# Patient Record
Sex: Female | Born: 1980
Health system: Southern US, Community
[De-identification: ages and names within clinical notes are randomized; demographics above are authoritative.]

## PROBLEM LIST (undated history)

## (undated) ENCOUNTER — Inpatient Hospital Stay (HOSPITAL_COMMUNITY): Payer: Self-pay

## (undated) DIAGNOSIS — R51 Headache: Secondary | ICD-10-CM

## (undated) DIAGNOSIS — K5900 Constipation, unspecified: Secondary | ICD-10-CM

## (undated) DIAGNOSIS — G43909 Migraine, unspecified, not intractable, without status migrainosus: Secondary | ICD-10-CM

## (undated) DIAGNOSIS — N390 Urinary tract infection, site not specified: Secondary | ICD-10-CM

## (undated) DIAGNOSIS — N61 Mastitis without abscess: Secondary | ICD-10-CM

## (undated) DIAGNOSIS — D239 Other benign neoplasm of skin, unspecified: Secondary | ICD-10-CM

## (undated) HISTORY — PX: NO PAST SURGERIES: SHX2092

## (undated) HISTORY — DX: Other benign neoplasm of skin, unspecified: D23.9

## (undated) HISTORY — DX: Headache: R51

## (undated) HISTORY — DX: Migraine, unspecified, not intractable, without status migrainosus: G43.909

## (undated) HISTORY — DX: Urinary tract infection, site not specified: N39.0

---

## 2008-08-27 ENCOUNTER — Ambulatory Visit: Payer: Self-pay | Admitting: Gynecology

## 2008-10-18 ENCOUNTER — Other Ambulatory Visit: Admission: RE | Admit: 2008-10-18 | Discharge: 2008-10-18 | Payer: Self-pay | Admitting: Gynecology

## 2008-10-18 ENCOUNTER — Ambulatory Visit: Payer: Self-pay | Admitting: Gynecology

## 2008-10-18 ENCOUNTER — Encounter: Payer: Self-pay | Admitting: Gynecology

## 2009-11-22 ENCOUNTER — Ambulatory Visit: Payer: Self-pay | Admitting: Gynecology

## 2009-12-13 ENCOUNTER — Other Ambulatory Visit: Admission: RE | Admit: 2009-12-13 | Discharge: 2009-12-13 | Payer: Self-pay | Admitting: Gynecology

## 2009-12-13 ENCOUNTER — Ambulatory Visit: Payer: Self-pay | Admitting: Gynecology

## 2010-03-12 ENCOUNTER — Ambulatory Visit: Payer: Self-pay | Admitting: Gynecology

## 2010-06-05 ENCOUNTER — Institutional Professional Consult (permissible substitution) (INDEPENDENT_AMBULATORY_CARE_PROVIDER_SITE_OTHER): Payer: BC Managed Care – PPO | Admitting: Gynecology

## 2010-06-05 DIAGNOSIS — N912 Amenorrhea, unspecified: Secondary | ICD-10-CM

## 2010-08-12 ENCOUNTER — Ambulatory Visit (INDEPENDENT_AMBULATORY_CARE_PROVIDER_SITE_OTHER): Payer: BC Managed Care – PPO | Admitting: Gynecology

## 2010-08-12 DIAGNOSIS — N912 Amenorrhea, unspecified: Secondary | ICD-10-CM

## 2010-08-12 DIAGNOSIS — IMO0002 Reserved for concepts with insufficient information to code with codable children: Secondary | ICD-10-CM

## 2010-08-12 DIAGNOSIS — R82998 Other abnormal findings in urine: Secondary | ICD-10-CM

## 2010-08-12 DIAGNOSIS — N949 Unspecified condition associated with female genital organs and menstrual cycle: Secondary | ICD-10-CM

## 2010-08-12 DIAGNOSIS — R141 Gas pain: Secondary | ICD-10-CM

## 2010-12-18 ENCOUNTER — Other Ambulatory Visit: Payer: Self-pay | Admitting: Obstetrics and Gynecology

## 2010-12-18 DIAGNOSIS — N6310 Unspecified lump in the right breast, unspecified quadrant: Secondary | ICD-10-CM

## 2010-12-25 ENCOUNTER — Ambulatory Visit
Admission: RE | Admit: 2010-12-25 | Discharge: 2010-12-25 | Disposition: A | Discharge status: Home / Self Care | Payer: BC Managed Care – PPO | Source: Ambulatory Visit | Attending: Obstetrics and Gynecology | Admitting: Obstetrics and Gynecology

## 2010-12-25 DIAGNOSIS — N6310 Unspecified lump in the right breast, unspecified quadrant: Secondary | ICD-10-CM

## 2011-01-06 ENCOUNTER — Ambulatory Visit
Admission: RE | Admit: 2011-01-06 | Discharge: 2011-01-06 | Disposition: A | Discharge status: Home / Self Care | Payer: BC Managed Care – PPO | Source: Ambulatory Visit | Attending: Obstetrics and Gynecology | Admitting: Obstetrics and Gynecology

## 2011-07-03 ENCOUNTER — Other Ambulatory Visit (INDEPENDENT_AMBULATORY_CARE_PROVIDER_SITE_OTHER): Payer: BC Managed Care – PPO

## 2011-07-03 DIAGNOSIS — N979 Female infertility, unspecified: Secondary | ICD-10-CM

## 2011-07-13 ENCOUNTER — Encounter (INDEPENDENT_AMBULATORY_CARE_PROVIDER_SITE_OTHER): Payer: BC Managed Care – PPO | Admitting: Obstetrics and Gynecology

## 2011-07-13 DIAGNOSIS — N970 Female infertility associated with anovulation: Secondary | ICD-10-CM

## 2011-08-10 ENCOUNTER — Other Ambulatory Visit: Payer: Self-pay

## 2011-08-10 ENCOUNTER — Encounter (INDEPENDENT_AMBULATORY_CARE_PROVIDER_SITE_OTHER): Payer: BC Managed Care – PPO | Admitting: Obstetrics and Gynecology

## 2011-08-10 DIAGNOSIS — N915 Oligomenorrhea, unspecified: Secondary | ICD-10-CM

## 2011-08-10 DIAGNOSIS — G4700 Insomnia, unspecified: Secondary | ICD-10-CM

## 2012-01-27 LAB — OB RESULTS CONSOLE RUBELLA ANTIBODY, IGM: Rubella: IMMUNE

## 2012-01-27 LAB — OB RESULTS CONSOLE ANTIBODY SCREEN: Antibody Screen: NEGATIVE

## 2012-01-27 LAB — OB RESULTS CONSOLE ABO/RH

## 2012-03-07 ENCOUNTER — Ambulatory Visit (INDEPENDENT_AMBULATORY_CARE_PROVIDER_SITE_OTHER): Payer: BC Managed Care – PPO | Admitting: Family Medicine

## 2012-03-07 ENCOUNTER — Encounter: Payer: Self-pay | Admitting: Family Medicine

## 2012-03-07 VITALS — BP 102/60 | HR 86 | Temp 98.8°F | Wt 134.0 lb

## 2012-03-07 DIAGNOSIS — Z7689 Persons encountering health services in other specified circumstances: Secondary | ICD-10-CM

## 2012-03-07 DIAGNOSIS — H6990 Unspecified Eustachian tube disorder, unspecified ear: Secondary | ICD-10-CM

## 2012-03-07 DIAGNOSIS — R51 Headache: Secondary | ICD-10-CM

## 2012-03-07 DIAGNOSIS — Z3490 Encounter for supervision of normal pregnancy, unspecified, unspecified trimester: Secondary | ICD-10-CM

## 2012-03-07 DIAGNOSIS — Z7189 Other specified counseling: Secondary | ICD-10-CM

## 2012-03-07 DIAGNOSIS — J309 Allergic rhinitis, unspecified: Secondary | ICD-10-CM

## 2012-03-07 DIAGNOSIS — Z331 Pregnant state, incidental: Secondary | ICD-10-CM

## 2012-03-07 NOTE — Patient Instructions (Addendum)
You can use nasal saline to help with your sinuses. If you use a refillable bottle - clean according to instructions.  We recommend the following healthy lifestyle measures: - eat a healthy diet consisting of lots of vegetables, fruits, beans, nuts, seeds, healthy meats such as white chicken and fish and whole grains.  - avoid fried foods, fast food, processed foods, sodas, red meet and other fattening foods.  - get a least 150 minutes of aerobic exercise per week.   Follow up as needed and after your pregnancy.

## 2012-03-07 NOTE — Progress Notes (Signed)
Chief Complaint  Patient presents with  . Establish Care    HPI: Michele Gilbert is here to establish care. This location is more convenient and husband sees Dr. Caryl Never. She currently is [redacted] weeks pregnant with her first child and is followed closely by her obstetrician as had been trying to get pregnant for 2 years.  Has the following concerns today:  Nasal congestion and ears popping -last several weeks -has seasonal allergies  Headaches: -chronic and unchanged -has discussed with her obstetrician and told could take tylenol -wonders if anything else she can use  Other Providers: -Dr. Seymour Bars ob/gyn: followed closely for her pregnancy, has had two [place] this pregnancy due to vaginal bleeding, but doing well now -Dermatology for hair loss in the past - no medications used in the past  Vaccines UTD per patient - had flu 2 weeks ago and ob/gyn checking labs and doing vaccines and pap.  ROS: See pertinent positives and negatives per HPI.  History reviewed. No pertinent past medical history.  No family history on file.  History   Social History  . Marital Status: Married    Spouse Name: N/A    Number of Children: N/A  . Years of Education: N/A   Social History Main Topics  . Smoking status: Never Smoker   . Smokeless tobacco: None  . Alcohol Use: No  . Drug Use: None  . Sexually Active: None   Other Topics Concern  . None   Social History Narrative  . None    Current outpatient prescriptions:ferrous fumarate (HEMOCYTE - 106 MG FE) 325 (106 FE) MG TABS, Take 1 tablet by mouth., Disp: , Rfl: ;  Prenatal Vit-Fe Fumarate-FA (MULTIVITAMIN-PRENATAL) 27-0.8 MG TABS, Take 1 tablet by mouth daily., Disp: , Rfl:   EXAM:  Filed Vitals:   03/07/12 1622  BP: 102/60  Pulse: 86  Temp: 98.8 F (37.1 C)    There is no height on file to calculate BMI.  GENERAL: vitals reviewed and listed above, alert, oriented, appears well hydrated and in no acute distress  HEENT:  atraumatic, conjunttiva clear, no obvious abnormalities on inspection of external nose and ears, normal appearance of ear canals and TMs, clear nasal congestion, mild post oropharyngeal erythema with PND, no tonsillar edema or exudate, no sinus TTP  NECK: no obvious masses on inspection  LUNGS: clear to auscultation bilaterally, no wheezes, rales or rhonchi, good air movement  CV: HRRR, no peripheral edema  MS: moves all extremities without noticeable abnormality  PSYCH: pleasant and cooperative, no obvious depression or anxiety  ASSESSMENT AND PLAN:  Discussed the following assessment and plan:  1. Allergic rhinitis   2. Encounter to establish care with new doctor   3. Pregnancy   4. Eustachian tube disorder   5. Headache    -advised nasal saline for eustachian tube dysfunction  -topical menthol products for headaches -obstetrician managing pregnancy and gyn care -We reviewed the PMH, PSH, FH, SH, Meds and Allergies. -We provided refills for any medications we will prescribe as needed. -We addressed current concerns per orders and patient instructions. -We have asked for records for pertinent exams, studies, vaccines and notes from previous providers. -We have advised patient to follow up per instructions below. -Influenza vaccine given today  -Patient advised to return or notify a doctor immediately if symptoms worsen or persist or new concerns arise.  Patient Instructions  You can use nasal saline to help with your sinuses. If you use a refillable bottle - clean  according to instructions.  We recommend the following healthy lifestyle measures: - eat a healthy diet consisting of lots of vegetables, fruits, beans, nuts, seeds, healthy meats such as white chicken and fish and whole grains.  - avoid fried foods, fast food, processed foods, sodas, red meet and other fattening foods.  - get a least 150 minutes of aerobic exercise per week.   Follow up as needed and after your  pregnancy.        Kriste Basque R.

## 2012-05-04 NOTE — L&D Delivery Note (Signed)
Delivery Note At 7:54 PM a viable female was delivered via Vaginal, Spontaneous Delivery (Presentation: DOA;  ).  APGAR: per nursing notes , ; weight  Per nursing notes Placenta status: Intact, Spontaneous.  Cord: 3 vessels with the following complications: None.  Cord pH: not needed. Moderate meconium, no cord issues, spontaneous cry prior to suctioning  Anesthesia: Epidural Local  Episiotomy: None Lacerations: 2nd degree; L Sulcus, R sulcus extending to R labia majora Suture Repair: 3.0 vicryl rapide, 3 individual running sutures to each separate tear Est. Blood Loss (mL): 400   Mom to postpartum.  Baby to nursery-stable.  Tavita Eastham A. 09/02/2012, 8:28 PM

## 2012-06-13 ENCOUNTER — Encounter (HOSPITAL_COMMUNITY): Payer: Self-pay | Admitting: *Deleted

## 2012-06-13 ENCOUNTER — Inpatient Hospital Stay (HOSPITAL_COMMUNITY)
Admission: AD | Admit: 2012-06-13 | Discharge: 2012-06-13 | Disposition: A | Discharge status: Home / Self Care | Payer: BC Managed Care – PPO | Source: Ambulatory Visit | Attending: Obstetrics and Gynecology | Admitting: Obstetrics and Gynecology

## 2012-06-13 ENCOUNTER — Inpatient Hospital Stay (HOSPITAL_COMMUNITY): Payer: BC Managed Care – PPO

## 2012-06-13 DIAGNOSIS — O26859 Spotting complicating pregnancy, unspecified trimester: Secondary | ICD-10-CM | POA: Insufficient documentation

## 2012-06-13 LAB — URINALYSIS, ROUTINE W REFLEX MICROSCOPIC
Bilirubin Urine: NEGATIVE
Ketones, ur: NEGATIVE mg/dL
Nitrite: NEGATIVE
pH: 7 (ref 5.0–8.0)

## 2012-06-13 LAB — URINE MICROSCOPIC-ADD ON

## 2012-06-13 NOTE — MAU Provider Note (Signed)
  History    spotting  CSN: [id number]  Arrival date and time: 06/13/12 1037   None     Chief Complaint  Patient presents with  . Vaginal Bleeding   HPI  OB History   Grav Para Term Preterm Abortions TAB SAB Ect Mult Living   1               Past Medical History  Diagnosis Date  . Headache     frequent  . Migraine   . UTI (urinary tract infection)     Past Surgical History  Procedure Laterality Date  . No past surgeries      Family History  Problem Relation Age of Onset  . Hyperlipidemia Father   . Colon cancer      grandparent  . Diabetes Other     History  Substance Use Topics  . Smoking status: Never Smoker   . Smokeless tobacco: Not on file  . Alcohol Use: No    Allergies:  Allergies  Allergen Reactions  . Latex Dermatitis    Prescriptions prior to admission  Medication Sig Dispense Refill  . acetaminophen (TYLENOL) 500 MG tablet Take 1,000 mg by mouth every 6 (six) hours as needed for pain. For pain      . Prenatal Vit-Fe Fumarate-FA (MULTIVITAMIN-PRENATAL) 27-0.8 MG TABS Take 1 tablet by mouth daily.        ROS Physical Exam   Blood pressure 117/74, pulse 84, temperature 98.2 F (36.8 C), temperature source Oral, resp. rate 20, height 5\' 3"  (1.6 m).  Physical Exam  MAU Course  Procedures  MDM na  Assessment and Plan  Third trimester spotting Nl CL on sono, nl AFI, nl placenta DC home  Michele Gilbert J 06/13/2012, 1:38 PM

## 2012-06-13 NOTE — H&P (Signed)
NAMEPEJA, ALLENDER NO.:  [id number]  MEDICAL RECORD NO.:  [id number]  LOCATION:  NW29                          FACILITY:  [facility]  PHYSICIAN:  Lenoard Aden, M.D.DATE OF BIRTH:  [birth year]  DATE OF ADMISSION:  06/13/2012 DATE OF DISCHARGE:                             HISTORY & PHYSICAL   CHIEF COMPLAINT:  Spotting at 27 weeks.  She is a 32 year old, [place] female G1, P0, at 29 and 6 days of gestation who presents with spotting this morning, had intercourse 48 hours prior.  Denies excessive cramping or heavy bleeding.  No leakage of fluid.  Good fetal movement.  She has no known drug allergies.  MEDICATIONS:  Prenatal vitamins.  She has a noncontributory obstetric history.  She is a nonsmoker, nondrinker.  She denies domestic or physical violence.  She has a family history of kidney stones, migraine headaches, hypertension, heart disease, and diabetes.  PHYSICAL EXAMINATION:  GENERAL:  A well-developed, well-nourished white female, in no acute distress. HEENT:  Normal. NECK:  Supple.  Full range of motion. LUNGS:  Clear. HEART:  Regular rhythm. ABDOMEN:  Soft, gravid, nontender.  No CVA tenderness. EXTREMITIES:  There are no cords. NEUROLOGIC:  Nonfocal. SKIN:  Intact.  IMPRESSION:  Fetal heart tones in the 120-130 beat per minute range with good acceleration.  Uterine irritability noted.  Ultrasound reveals cervical length to be 3.8 cm.  Normal AFI normal placenta.  IMPRESSION:  A 27-week intrauterine pregnancy with spotting, noted no evidence of acute bleeding, normal appearing placenta, normal AFI, normal cervical length, reassuring fetal surveillance.  PLAN:  Discharge home.  Bleeding precautions given.  Follow up in the office as scheduled.     Lenoard Aden, M.D.     RJT/MEDQ  D:  06/13/2012  T:  06/13/2012  Job:  [id number]

## 2012-06-13 NOTE — MAU Note (Signed)
Pt reports having lower back pain and pelvic pressure.  Went to [facility] and noticed some bleeding.

## 2012-06-14 LAB — URINE CULTURE: Colony Count: 65000

## 2012-09-02 ENCOUNTER — Encounter (HOSPITAL_COMMUNITY): Payer: Self-pay | Admitting: Anesthesiology

## 2012-09-02 ENCOUNTER — Inpatient Hospital Stay (HOSPITAL_COMMUNITY): Payer: BC Managed Care – PPO | Admitting: Anesthesiology

## 2012-09-02 ENCOUNTER — Inpatient Hospital Stay (HOSPITAL_COMMUNITY)
Admission: AD | Admit: 2012-09-02 | Discharge: 2012-09-04 | DRG: 373 | Disposition: A | Discharge status: Home / Self Care | Payer: BC Managed Care – PPO | Source: Ambulatory Visit | Attending: Obstetrics & Gynecology | Admitting: Obstetrics & Gynecology

## 2012-09-02 ENCOUNTER — Encounter (HOSPITAL_COMMUNITY): Payer: Self-pay | Admitting: *Deleted

## 2012-09-02 DIAGNOSIS — O9903 Anemia complicating the puerperium: Secondary | ICD-10-CM | POA: Diagnosis not present

## 2012-09-02 DIAGNOSIS — D62 Acute posthemorrhagic anemia: Secondary | ICD-10-CM | POA: Diagnosis not present

## 2012-09-02 LAB — TYPE AND SCREEN
ABO/RH(D): O POS
Antibody Screen: NEGATIVE

## 2012-09-02 LAB — CBC
MCH: 24 pg — ABNORMAL LOW (ref 26.0–34.0)
MCHC: 31.5 g/dL (ref 30.0–36.0)
MCV: 76 fL — ABNORMAL LOW (ref 78.0–100.0)
Platelets: 255 10*3/uL (ref 150–400)
RDW: 16.6 % — ABNORMAL HIGH (ref 11.5–15.5)
WBC: 11.5 10*3/uL — ABNORMAL HIGH (ref 4.0–10.5)

## 2012-09-02 LAB — ABO/RH: ABO/RH(D): O POS

## 2012-09-02 MED ORDER — ACETAMINOPHEN 325 MG PO TABS: 650.0000 mg | ORAL_TABLET | ORAL | Status: DC | PRN

## 2012-09-02 MED ORDER — FENTANYL 2.5 MCG/ML BUPIVACAINE 1/10 % EPIDURAL INFUSION (WH - ANES)
INTRAMUSCULAR | Status: DC | PRN
  Administered 2012-09-02: 14 mL/h via EPIDURAL

## 2012-09-02 MED ORDER — ZOLPIDEM TARTRATE 5 MG PO TABS: 5.0000 mg | ORAL_TABLET | Freq: Every evening | ORAL | Status: DC | PRN

## 2012-09-02 MED ORDER — OXYCODONE-ACETAMINOPHEN 5-325 MG PO TABS: 1.0000 | ORAL_TABLET | ORAL | Status: DC | PRN

## 2012-09-02 MED ORDER — DIPHENHYDRAMINE HCL 50 MG/ML IJ SOLN: 12.5000 mg | INTRAMUSCULAR | Status: DC | PRN

## 2012-09-02 MED ORDER — OXYTOCIN BOLUS FROM INFUSION
500.0000 mL | INTRAVENOUS | Status: DC
  Administered 2012-09-02: 500 mL via INTRAVENOUS

## 2012-09-02 MED ORDER — LANOLIN HYDROUS EX OINT: TOPICAL_OINTMENT | CUTANEOUS | Status: DC | PRN

## 2012-09-02 MED ORDER — LACTATED RINGERS IV SOLN
INTRAVENOUS | Status: DC
  Administered 2012-09-02: 06:00:00 via INTRAVENOUS

## 2012-09-02 MED ORDER — PRENATAL MULTIVITAMIN CH
1.0000 | ORAL_TABLET | Freq: Every day | ORAL | Status: DC
  Administered 2012-09-03 – 2012-09-04 (×2): 1 via ORAL
  Filled 2012-09-02 (×2): qty 1

## 2012-09-02 MED ORDER — LIDOCAINE HCL (PF) 1 % IJ SOLN
INTRAMUSCULAR | Status: DC | PRN
  Administered 2012-09-02 (×2): 8 mL

## 2012-09-02 MED ORDER — ONDANSETRON HCL 4 MG/2ML IJ SOLN: 4.0000 mg | INTRAMUSCULAR | Status: DC | PRN

## 2012-09-02 MED ORDER — PHENYLEPHRINE 40 MCG/ML (10ML) SYRINGE FOR IV PUSH (FOR BLOOD PRESSURE SUPPORT)
80.0000 ug | PREFILLED_SYRINGE | INTRAVENOUS | Status: DC | PRN
  Administered 2012-09-02: 200 ug via INTRAVENOUS
  Filled 2012-09-02: qty 2

## 2012-09-02 MED ORDER — LIDOCAINE HCL (PF) 1 % IJ SOLN
30.0000 mL | INTRAMUSCULAR | Status: DC | PRN
  Administered 2012-09-02: 30 mL via SUBCUTANEOUS
  Filled 2012-09-02 (×2): qty 30

## 2012-09-02 MED ORDER — CITRIC ACID-SODIUM CITRATE 334-500 MG/5ML PO SOLN: 30.0000 mL | ORAL | Status: DC | PRN

## 2012-09-02 MED ORDER — OXYTOCIN 40 UNITS IN LACTATED RINGERS INFUSION - SIMPLE MED
62.5000 mL/h | INTRAVENOUS | Status: DC
  Filled 2012-09-02: qty 1000

## 2012-09-02 MED ORDER — OXYTOCIN 40 UNITS IN LACTATED RINGERS INFUSION - SIMPLE MED
1.0000 m[IU]/min | INTRAVENOUS | Status: DC
  Administered 2012-09-02: 6 m[IU]/min via INTRAVENOUS
  Administered 2012-09-02: 2 m[IU]/min via INTRAVENOUS

## 2012-09-02 MED ORDER — IBUPROFEN 600 MG PO TABS
600.0000 mg | ORAL_TABLET | Freq: Four times a day (QID) | ORAL | Status: DC
  Administered 2012-09-03 – 2012-09-04 (×6): 600 mg via ORAL
  Filled 2012-09-02 (×6): qty 1

## 2012-09-02 MED ORDER — DIPHENHYDRAMINE HCL 25 MG PO CAPS: 25.0000 mg | ORAL_CAPSULE | Freq: Four times a day (QID) | ORAL | Status: DC | PRN

## 2012-09-02 MED ORDER — WITCH HAZEL-GLYCERIN EX PADS: 1.0000 "application " | MEDICATED_PAD | CUTANEOUS | Status: DC | PRN

## 2012-09-02 MED ORDER — EPHEDRINE 5 MG/ML INJ
10.0000 mg | INTRAVENOUS | Status: DC | PRN
  Filled 2012-09-02: qty 2

## 2012-09-02 MED ORDER — BENZOCAINE-MENTHOL 20-0.5 % EX AERO
1.0000 "application " | INHALATION_SPRAY | CUTANEOUS | Status: DC | PRN
  Administered 2012-09-03: 1 via TOPICAL
  Filled 2012-09-02 (×2): qty 56

## 2012-09-02 MED ORDER — ONDANSETRON HCL 4 MG PO TABS: 4.0000 mg | ORAL_TABLET | ORAL | Status: DC | PRN

## 2012-09-02 MED ORDER — LACTATED RINGERS IV SOLN: 500.0000 mL | INTRAVENOUS | Status: DC | PRN

## 2012-09-02 MED ORDER — POLYETHYLENE GLYCOL 3350 17 G PO PACK
17.0000 g | PACK | Freq: Every day | ORAL | Status: DC | PRN
  Filled 2012-09-02: qty 1

## 2012-09-02 MED ORDER — DIBUCAINE 1 % RE OINT
1.0000 "application " | TOPICAL_OINTMENT | RECTAL | Status: DC | PRN
  Filled 2012-09-02: qty 28

## 2012-09-02 MED ORDER — PHENYLEPHRINE 40 MCG/ML (10ML) SYRINGE FOR IV PUSH (FOR BLOOD PRESSURE SUPPORT)
80.0000 ug | PREFILLED_SYRINGE | INTRAVENOUS | Status: DC | PRN
  Filled 2012-09-02: qty 5
  Filled 2012-09-02: qty 2

## 2012-09-02 MED ORDER — LACTATED RINGERS IV SOLN
500.0000 mL | Freq: Once | INTRAVENOUS | Status: AC
  Administered 2012-09-02: 1000 mL via INTRAVENOUS

## 2012-09-02 MED ORDER — TERBUTALINE SULFATE 1 MG/ML IJ SOLN: 0.2500 mg | Freq: Once | INTRAMUSCULAR | Status: DC | PRN

## 2012-09-02 MED ORDER — ONDANSETRON HCL 4 MG/2ML IJ SOLN: 4.0000 mg | Freq: Four times a day (QID) | INTRAMUSCULAR | Status: DC | PRN

## 2012-09-02 MED ORDER — TETANUS-DIPHTH-ACELL PERTUSSIS 5-2.5-18.5 LF-MCG/0.5 IM SUSP: 0.5000 mL | Freq: Once | INTRAMUSCULAR | Status: DC

## 2012-09-02 MED ORDER — FLEET ENEMA 7-19 GM/118ML RE ENEM: 1.0000 | ENEMA | RECTAL | Status: DC | PRN

## 2012-09-02 MED ORDER — EPHEDRINE 5 MG/ML INJ
10.0000 mg | INTRAVENOUS | Status: DC | PRN
  Filled 2012-09-02: qty 4
  Filled 2012-09-02: qty 2

## 2012-09-02 MED ORDER — SIMETHICONE 80 MG PO CHEW: 80.0000 mg | CHEWABLE_TABLET | ORAL | Status: DC | PRN

## 2012-09-02 MED ORDER — FENTANYL 2.5 MCG/ML BUPIVACAINE 1/10 % EPIDURAL INFUSION (WH - ANES)
14.0000 mL/h | INTRAMUSCULAR | Status: DC | PRN
  Administered 2012-09-02: 14 mL/h via EPIDURAL
  Filled 2012-09-02 (×2): qty 125

## 2012-09-02 MED ORDER — IBUPROFEN 600 MG PO TABS
600.0000 mg | ORAL_TABLET | Freq: Four times a day (QID) | ORAL | Status: DC | PRN
  Administered 2012-09-02: 600 mg via ORAL
  Filled 2012-09-02: qty 1

## 2012-09-02 MED ORDER — OXYCODONE-ACETAMINOPHEN 5-325 MG PO TABS
1.0000 | ORAL_TABLET | ORAL | Status: DC | PRN
  Administered 2012-09-03 (×2): 2 via ORAL
  Administered 2012-09-03 (×2): 1 via ORAL
  Administered 2012-09-04: 2 via ORAL
  Filled 2012-09-02: qty 1
  Filled 2012-09-02: qty 2
  Filled 2012-09-02: qty 1
  Filled 2012-09-02 (×2): qty 2

## 2012-09-02 MED ORDER — BISACODYL 10 MG RE SUPP: 10.0000 mg | Freq: Every day | RECTAL | Status: DC | PRN

## 2012-09-02 MED ORDER — FLEET ENEMA 7-19 GM/118ML RE ENEM: 1.0000 | ENEMA | Freq: Every day | RECTAL | Status: DC | PRN

## 2012-09-02 MED ORDER — SENNOSIDES-DOCUSATE SODIUM 8.6-50 MG PO TABS
2.0000 | ORAL_TABLET | Freq: Every day | ORAL | Status: DC
  Administered 2012-09-03: 2 via ORAL

## 2012-09-02 MED ORDER — DOCUSATE SODIUM 100 MG PO CAPS
100.0000 mg | ORAL_CAPSULE | Freq: Two times a day (BID) | ORAL | Status: DC
  Administered 2012-09-03 – 2012-09-04 (×3): 100 mg via ORAL
  Filled 2012-09-02 (×3): qty 1

## 2012-09-02 NOTE — H&P (Signed)
Michele Gilbert is a 32 y.o. female G1 at 39.3 wks presenting in active labor with contractions for several hours, no leaking of fluid or bleeding. Good FMs.  [facility]- Dr Seymour Bars, uncomplicated, nl labs, ultrasound with good interval growth sono, GBS neg. Took TDaP. This spontaneous preg after infertility.   History OB History   Grav Para Term Preterm Abortions TAB SAB Ect Mult Living   1              Past Medical History  Diagnosis Date  . Headache     frequent  . Migraine   . UTI (urinary tract infection)    Past Surgical History  Procedure Laterality Date  . No past surgeries     Family History: family history includes Colon cancer in an unspecified family member; Diabetes in her other; and Hyperlipidemia in her father. Social History:  reports that she has never smoked. She does not have any smokeless tobacco history on file. She reports that she does not drink alcohol or use illicit drugs.   Prenatal Transfer Tool  Maternal Diabetes: No Genetic Screening: Normal- ultrascreen neg, AFP1 neg. Maternal Ultrasounds/Referrals: Normal Fetal Ultrasounds or other Referrals:  None Maternal Substance Abuse:  No Significant Maternal Medications:  None Significant Maternal Lab Results:  Lab values include: Group B Strep negative Other Comments:  None  Review of Systems  Constitutional: Negative for fever.  Respiratory: Negative for cough.   Cardiovascular: Negative for chest pain.  Genitourinary: Negative for dysuria.  Skin: Negative for rash.  Neurological: Negative for headaches.  Psychiatric/Behavioral: Negative for depression.   Exam Physical Exam  Blood pressure 104/56, pulse 75, temperature 99.1 F (37.3 C), temperature source Oral, resp. rate 22, height 5\' 3"  (1.6 m), weight 162 lb (73.483 kg), SpO2 96.00%.  A&O x 3, no acute distress. Pleasant HEENT neg, no thyromegaly Lungs CTA bilat CV RRR, S1S2 normal Abdo soft, non tender, non acute Extr no edema/  tenderness Pelvic Dilation: 8, Effacement (%): 100, Station: -1. AROM, clear fluid.  FHT  130s/ + accels/ no decels/ moderate variab. Category I Toco q 5 minutes.  Prenatal labs: ABO, Rh: --/--/O POS (05/02 7846) Antibody: PENDING (05/02 0603) Rubella: Immune (09/25 0000) RPR: Nonreactive (09/25 0000)  HBsAg: Negative (09/25 0000)  HIV: Non-reactive (09/25 0000)  GBS: Negative (04/02 0000)  Glucola nl Anatomy sono normal, Female, growth sono last wk, normal  Ultrascreen neg, AFP1 neg   Assessment/Plan: 32 yo, G1 at 39.3 wks in active labor, s/p Epidural and doing well, FHT-category I Amniotomy, clear fluid, GBS neg. EFW 7.1/2 lbs. Anticipate SVD.    Cadie Sorci R 09/02/2012, 9:57 AM

## 2012-09-02 NOTE — Progress Notes (Signed)
S: Doing well, no complaints, pain well controlled with dense epidural, not feeling contractions. Pushing for 1.5 hrs in total with about 1 hr rest and re-positioning in between pushing times  O: BP 114/63  Pulse 113  Temp(Src) 99.4 F (37.4 C) (Axillary)  Resp 18  Ht 5\' 3"  (1.6 m)  Wt 73.483 kg (162 lb)  BMI 28.7 kg/m2  SpO2 96%   FHT:  FHR: 120s bpm, variability: moderate,  accelerations:  Present,  decelerations:  Absent UC:   irregular, every 4-5 minutes SVE:   Dilation: 10 Effacement (%): 100 Station: +2 Exam by:: Dherr rn OP position   A / P:  31 y.o.  Obstetric History   G1   P0   T0   P0   A0   TAB0   SAB0   E0   M0   L0    at [redacted]w[redacted]d Augmentation of labor, progressing well Just now started pitocin to help with frequency and strength of contractions as contractions have spaced out after rest. Will cont to increase pitocin as pushing weakly, will also decrease epidural EFW 8.5# by u/s last wk but pelvis appears adequate, no significant molding/ caput  Fetal Wellbeing:  Category I Pain Control:  Epidural  Anticipated MOD:  NSVD  Michele Gilbert A. 09/02/2012, 7:04 PM

## 2012-09-02 NOTE — Anesthesia Preprocedure Evaluation (Signed)
Anesthesia Evaluation  Patient identified by MRN, date of birth, ID band Patient awake    Reviewed: Allergy & Precautions, H&P , NPO status , Patient's Chart, lab work & pertinent test results  Airway Mallampati: I TM Distance: >3 FB Neck ROM: full    Dental no notable dental hx.    Pulmonary neg pulmonary ROS,    Pulmonary exam normal       Cardiovascular negative cardio ROS      Neuro/Psych negative psych ROS   GI/Hepatic negative GI ROS, Neg liver ROS,   Endo/Other  negative endocrine ROS  Renal/GU negative Renal ROS  negative genitourinary   Musculoskeletal negative musculoskeletal ROS (+)   Abdominal Normal abdominal exam  (+)   Peds negative pediatric ROS (+)  Hematology negative hematology ROS (+)   Anesthesia Other Findings   Reproductive/Obstetrics (+) Pregnancy                           Anesthesia Physical Anesthesia Plan  ASA: II  Anesthesia Plan: Epidural   Post-op Pain Management:    Induction:   Airway Management Planned:   Additional Equipment:   Intra-op Plan:   Post-operative Plan:   Informed Consent: I have reviewed the patients History and Physical, chart, labs and discussed the procedure including the risks, benefits and alternatives for the proposed anesthesia with the patient or authorized representative who has indicated his/her understanding and acceptance.     Plan Discussed with:   Anesthesia Plan Comments:         Anesthesia Quick Evaluation  

## 2012-09-02 NOTE — Progress Notes (Signed)
Michele Gilbert is a 32 y.o. G1P0 at 102w3d, active spontaneous labor, progressing well  Objective: BP 119/53  Pulse 80  Temp(Src) 99.5 F (37.5 C) (Oral)  Resp 18  Ht 5\' 3"  (1.6 m)  Wt 162 lb (73.483 kg)  BMI 28.7 kg/m2  SpO2 96%   FHT:  FHR: 140s bpm, variability: moderate,  accelerations:  Present,  decelerations:  Absent UC:   regular, every 5 minutes SVE:   Dilation: Lip/rim Effacement (%): 100 Station: 0;+1 Exam by:: Dherr rn  Labs: Lab Results  Component Value Date   WBC 11.5* 09/02/2012   HGB 10.3* 09/02/2012   HCT 32.7* 09/02/2012   MCV 76.0* 09/02/2012   PLT 255 09/02/2012    Assessment / Plan: Active labor, anticipate SVD. FHT category I, epidural working well.   Mckenize Mezera R 09/02/2012, 1:25 PM

## 2012-09-02 NOTE — MAU Note (Signed)
PT SAYS SHE STARTED HURTING  BAD  AT 0200-    VE IN OFFICE  - CLOSED.   DENIES HSV AND MRSA

## 2012-09-03 LAB — CBC
MCV: 76.6 fL — ABNORMAL LOW (ref 78.0–100.0)
Platelets: 209 10*3/uL (ref 150–400)
RBC: 3.64 MIL/uL — ABNORMAL LOW (ref 3.87–5.11)
WBC: 16 10*3/uL — ABNORMAL HIGH (ref 4.0–10.5)

## 2012-09-03 MED ORDER — MAGNESIUM OXIDE 400 (241.3 MG) MG PO TABS
200.0000 mg | ORAL_TABLET | Freq: Every day | ORAL | Status: DC
  Administered 2012-09-03 – 2012-09-04 (×2): 200 mg via ORAL
  Filled 2012-09-03 (×3): qty 0.5

## 2012-09-03 MED ORDER — POLYSACCHARIDE IRON COMPLEX 150 MG PO CAPS
150.0000 mg | ORAL_CAPSULE | Freq: Every day | ORAL | Status: DC
  Administered 2012-09-03 – 2012-09-04 (×2): 150 mg via ORAL
  Filled 2012-09-03 (×3): qty 1

## 2012-09-03 NOTE — Progress Notes (Signed)
PPD 1 SVD  S:  Reports feeling ok             Tolerating po/ No nausea or vomiting             Bleeding is light             Pain controlled with motrin and percocet             Up ad lib / ambulatory / voiding QS  Newborn breast feeding  / Circumcision planned O:               VS: BP 91/55  Pulse 81  Temp(Src) 98.1 F (36.7 C) (Oral)  Resp 18  Ht 5\' 3"  (1.6 m)  Wt 73.483 kg (162 lb)  BMI 28.7 kg/m2  SpO2 96%   LABS:  Recent Labs  09/02/12 0603 09/03/12 0616  WBC 11.5* 16.0*  HGB 10.3* 8.8*  PLT 255 209                                         I&O: Intake/Output     05/02 0701 - 05/03 0700 05/03 0701 - 05/04 0700   Urine (mL/kg/hr) 1200 (0.7)    Blood 400 (0.2)    Total Output 1600     Net -1600                        Physical Exam:             Alert and oriented X3  Lungs: Clear and unlabored  Heart: regular rate and rhythm / no mumurs  Abdomen: soft, non-tender, non-distended              Fundus: firm, non-tender, Ueven  Perineum: mild edema / ice pack in place  Lochia: light  Extremities: trace edema, no calf pain or tenderness    A: PPD # 1              IDA compounded with ABL anemia  Doing well - stable status  P:  Routine post partum orders             Iron and colace PP    Marlinda Mike CNM, MSN 09/03/2012, 9:17 AM

## 2012-09-03 NOTE — Anesthesia Postprocedure Evaluation (Signed)
Anesthesia Post Note  Patient: Michele Gilbert  Procedure(s) Performed: * No procedures listed *  Anesthesia type: Epidural  Patient location: Mother/Baby  Post pain: Pain level controlled  Post assessment: Post-op Vital signs reviewed  Last Vitals:  Filed Vitals:   09/03/12 0552  BP: 91/55  Pulse: 81  Temp: 36.7 C  Resp: 18    Post vital signs: Reviewed  Level of consciousness:alert  Complications: No apparent anesthesia complications

## 2012-09-03 NOTE — Lactation Note (Signed)
This note was copied from the chart of [name]. Lactation Consultation Note Initial consult for this first time mom. Mom holding baby STS. Baby now 75 hours old, mom states he has had a few good feedings, but she does not feel confident in positioning and latching baby without assistance. Demonstrated positions and latch using a doll, and enc dad to assist mom getting baby positioned. Mom and dad do require assistance with positioning; enc mom to call if needed for next feeding. Reviewed br feeding basics. Lactation brochure provided and mom made aware of lactation services. Questions answered.  Patient Name: [name] MVHQI'O Date: 09/03/2012 Reason for consult: Initial assessment   Maternal Data Formula Feeding for Exclusion: No Has patient been taught Hand Expression?: Yes (will need review) Does the patient have breastfeeding experience prior to this delivery?: No  Feeding Feeding Type: Breast Milk Feeding method: Breast Length of feed: 10 min  LATCH Score/Interventions Latch: Grasps breast easily, tongue down, lips flanged, rhythmical sucking. Intervention(s): Adjust position;Assist with latch  Audible Swallowing: A few with stimulation Intervention(s): Skin to skin  Type of Nipple: Everted at rest and after stimulation  Comfort (Breast/Nipple): Soft / non-tender     Hold (Positioning): Assistance needed to correctly position infant at breast and maintain latch. Intervention(s): Breastfeeding basics reviewed;Support Pillows;Position options;Skin to skin  LATCH Score: 8  Lactation Tools Discussed/Used     Consult Status Consult Status: Follow-up Follow-up type: In-patient    Octavio Manns [facility] 09/03/2012, 12:46 PM

## 2012-09-04 MED ORDER — IBUPROFEN 600 MG PO TABS: 600.0000 mg | ORAL_TABLET | Freq: Four times a day (QID) | ORAL | Status: DC

## 2012-09-04 MED ORDER — POLYSACCHARIDE IRON COMPLEX 150 MG PO CAPS: 150.0000 mg | ORAL_CAPSULE | Freq: Every day | ORAL | Status: DC

## 2012-09-04 MED ORDER — MAGNESIUM OXIDE 400 (241.3 MG) MG PO TABS: 400.0000 mg | ORAL_TABLET | Freq: Every day | ORAL | Status: DC

## 2012-09-04 MED ORDER — OXYCODONE-ACETAMINOPHEN 5-325 MG PO TABS: 1.0000 | ORAL_TABLET | ORAL | Status: DC | PRN

## 2012-09-04 NOTE — Progress Notes (Signed)
PPD 2 SVD  S:  Reports feeling well - ready to go home             Tolerating po/ No nausea or vomiting             Bleeding is light             Pain controlled with motrin and percocet             Up ad lib / ambulatory / voiding QS  Newborn breast feeding  O:               VS: BP 101/64  Pulse 62  Temp(Src) 98.2 F (36.8 C) (Oral)  Resp 18  Ht 5\' 3"  (1.6 m)  Wt 73.483 kg (162 lb)  BMI 28.7 kg/m2  SpO2 96%   LABS:  Recent Labs  09/02/12 0603 09/03/12 0616  WBC 11.5* 16.0*  HGB 10.3* 8.8*  PLT 255 209                         Physical Exam:             Alert and oriented X3  Abdomen: soft, non-tender, non-distended              Fundus: firm, non-tender, U-1  Perineum: mild edema  Lochia: light  Extremities: no edema, no calf pain or tenderness    A: PPD # 2   Doing well - stable status  P:  Routine post partum orders  DC home             WOB booklet - instructions reviewed  Marlinda Mike CNM, MSN 09/04/2012, 10:31 AM

## 2012-09-04 NOTE — Discharge Summary (Signed)
Obstetric Discharge Summary  Reason for Admission: onset of labor Prenatal Procedures: none Intrapartum Procedures: spontaneous vaginal delivery Postpartum Procedures: none Complications-Operative and Postpartum: sulcus and 2nd degree perineal laceration Hemoglobin  Date Value Range Status  09/03/2012 8.8* 12.0 - 15.0 g/dL Final     HCT  Date Value Range Status  09/03/2012 27.9* 36.0 - 46.0 % Final    Physical Exam:  General: alert, cooperative and no distress Lochia: appropriate Uterine Fundus: firm Incision: healing well DVT Evaluation: No evidence of DVT seen on physical exam.  Discharge Diagnoses: Term Pregnancy-delivered and IDA of pregnancy compounded with ABL  Discharge Information: Date: 09/04/2012 Activity: pelvic rest Diet: routine Medications: PNV, Ibuprofen, Colace, Iron, Percocet and magneisum (w/iron) and Miralax PRN constipation Condition: stable Instructions: refer to practice specific booklet Discharge to: home Follow-up Information   Follow up with LAVOIE,MARIE-LYNE, MD. Schedule an appointment as soon as possible for a visit in 6 weeks.   Contact information:   [address] [place] [address] [phone]       Newborn Data: Live born female  Birth Weight: 7 lb 10.8 oz (3480 g) APGAR: 7, 9  Home with mother.  Marlinda Mike 09/04/2012, 10:48 AM

## 2012-09-04 NOTE — Progress Notes (Signed)
D/C instructions given to mother & FOB - all questions answered.

## 2012-09-04 NOTE — Progress Notes (Signed)
Returned to pt room to give medications & d/c home instructions - pt sleeping - told spouse to call when she awakens

## 2012-09-06 ENCOUNTER — Ambulatory Visit (HOSPITAL_COMMUNITY)
Admission: RE | Admit: 2012-09-06 | Discharge: 2012-09-06 | Disposition: A | Discharge status: Home / Self Care | Payer: BC Managed Care – PPO | Source: Ambulatory Visit | Attending: Obstetrics & Gynecology | Admitting: Obstetrics & Gynecology

## 2012-09-06 ENCOUNTER — Encounter (HOSPITAL_COMMUNITY)
Admission: RE | Admit: 2012-09-06 | Discharge: 2012-09-06 | Disposition: A | Discharge status: Home / Self Care | Payer: BC Managed Care – PPO | Source: Ambulatory Visit | Attending: Obstetrics & Gynecology | Admitting: Obstetrics & Gynecology

## 2012-09-06 DIAGNOSIS — O923 Agalactia: Secondary | ICD-10-CM | POA: Insufficient documentation

## 2012-09-07 ENCOUNTER — Ambulatory Visit (HOSPITAL_COMMUNITY)
Admission: RE | Admit: 2012-09-07 | Discharge: 2012-09-07 | Disposition: A | Discharge status: Home / Self Care | Payer: BC Managed Care – PPO | Source: Ambulatory Visit | Attending: Obstetrics & Gynecology | Admitting: Obstetrics & Gynecology

## 2012-09-07 NOTE — Lactation Note (Signed)
Infant Lactation Consultation Outpatient Visit Note  Patient Name: Michele Gilbert                 BABY [name] Date of Birth: [birth year]                              DOB   [birth year] Birth Weight:                                                Birth wt     7-10   D/c wt on 5/3 14   7-3.9   DOL 4 WT   On 09/06/12   6-8 Gestational Age at Delivery: Gestational Age: <None> Type of Delivery:   Breastfeeding History Frequency of Breastfeeding:  Every 1-2 hours  Length of Feeding: 20-30 minutes Voids: 2 per day Stools:  0-1  Supplementing / Method: Pumping:  Type of Pump:was not pumping   Frequency:  Volume:    Comments:   Mom has a medela PIS at home    Consultation Evaluation:     4 day old term baby, seen at [facility] today by Dr. Hosie Poisson. Baby  Birth weight was 7 lbs 10 ounces, and today's weight is  6 pounds 8 ounces. On exam, mom's breasts are full and very tender, probably engorged. Mom states her breast just become full this morning. With breast feeding, the baby was only able to transfer 8 mls, and mom was in severe pain with baby's latch. Mom has open area on left nipple, and baby is pinching nipple with suck, evidenced by pinched shape of nipple after feed. I had mom pump with a [employer] DEP, and she was able to express 18 mls of colostrum.    This was fed to the baby at mom's breast with an SNS system, and the baby drank the 18 mls plus an additional 4 mls from the breast, at this time. This made the total feed this vistit 30 mls. The baby was very content after this, and when he was feeding with a god flow of milk, mom was no longer uncomfortable with breast feeding. I think besides  the SNS, the fact that mom had pumped, and started her milk flowing, the baby was able to get into a more normal sucking pattern, transferring more, and more comfortable to mom. Mom rented a [employer] DEP, and went home to pump every 2-3 hours, and to feed the baby with an SNS system every 2-3 hours. Mom  will come back on 5/7 for a weight check on the baby, at 2 pm.  Initial Feeding Assessment: Pre-feed [id number] Post-feed [id number] Amount Transferred:8 mls Comments:20 minutes at breast - all fast sucking, no slow suck/swallow/breathe   Additional Feeding Assessment: Pre-feed [id number] Post-feed [id number] Amount Transferred:22 Comments:  18 mls from EBM/colostrum via SNS at breast     Much better sucking pattern - swallows seen and heard       Baby evidenced normal swallows after SNS and mom pumping, even without SNS  Additional Feeding Assessment: Pre-feed Weight: Post-feed Weight: Amount Transferred: Comments:  Total Breast milk Transferred this Visit: 30 Total Supplement Given:   Additional Interventions:   Follow-UpMay 7, 2014 at @ pm, for baby's weight check       [name] 09/07/2012, 10:48  AM    

## 2012-09-07 NOTE — Lactation Note (Signed)
Infant Lactation Consultation Outpatient Visit Note  Patient Name: Michele Gilbert      Michele Gilbert Date of Birth: 01-Jan-1981                  DOB [birth year] Birth Weight:                                     Birth weight 7-10  5/6 wt 6-8   today's weight    6-12.3 Gestational Age at Delivery: Gestational Age: <None>  term Type of Delivery:   Breastfeeding History Frequency of Breastfeeding:  Length of Feeding:  Voids: 4 wet diapers  In last 24 hours Stools:    No stools, lots of flatulance  Supplementing / Method: Pumping:  Type of Pump:Symphony DEP    Frequency:every 2 hours   Volume:  Mom expressed 232 mls (7.7 ounces) of colostrum since 5 pm yesterday, which was bottle fed to the Michele. She brought in an additional 36 mls which she expressed at 11 am, which will be fed to the Michele, if needed, after breast feeding.   Comments:      Consultation Evaluation:     Follow up weight on Michele Gilbert, who gained 4 ounces since yesterday. Mom pumped and bottle fed  Every 2 hours since 5 pm yesterday. She was able to express a total of 232 mls (7.7 ounces), which she bottle fed to the babyT.he parents tried the SNS, but could not get it to work. Mom tried breast feeding today - Michele Gilbert was hungry, appeared to be latched well, but was not swallowing much, and transferred only 12 mls, and kept pulling back off mom's breast - probably because he could not get the milk to flow fast enough. I then tried a 24 nipple shiled, which kept Gilbert latched and sucking,  and in 8 minutes Gilbert transferred 6 mls. Colostrum was seen in the shield. It may be that Michele Gilbert does not yet have the strength to transfer at the breast . Mom then bottle fed Gilbert 36 mls she had pumped at 11 am today, This makes the total he received at this consult 54 mls. He was very content. He looks better ,fuller, more alert,  than yesterday. The plan is for mom to breast feed with the nipple shield, every 3 hours, for up to 30 minutes. Mom will then  pump both breasts, and dad will offer a bottle of the EBM she pumped 3 hours earlier. They are keeping a record of Michele's feeds and voids and stools, and will be seen in lactation in 2 days, on May 9th.   Mom has been using all -purpose nipple cream on her nipples, and her left nipple is healing well, yet still very tender.  Initial Feeding Assessment: Pre-feed VHQION:[id number] Post-feed MWUXLK:4401 Amount Transferred:12 Comments:in 15 -20 minutes at the breast   Additional Feeding Assessment: Pre-feed [id number]:6644 Post-feed IHKVQQ:5956 Amount Transferred:6 Comments:with 24 nipple shield, at breast for 10 minutes  Additional Feeding Assessment: Pre-feed Weight: Post-feed Weight: Amount Transferred: Comments:  Total Breast milk Transferred this Visit:  Total Supplement Given:   Additional Interventions:   Follow-Up   Mom has a follow up lactation appointment for Friday, may 9th at 2;30 pm      [name] 09/07/2012, 2:19 PM   2

## 2012-09-09 ENCOUNTER — Ambulatory Visit (HOSPITAL_COMMUNITY)
Admit: 2012-09-09 | Discharge: 2012-09-09 | Disposition: A | Discharge status: Home / Self Care | Payer: BC Managed Care – PPO | Attending: Obstetrics & Gynecology | Admitting: Obstetrics & Gynecology

## 2012-09-09 NOTE — Lactation Note (Signed)
Adult Lactation Consultation Outpatient Visit Note  Patient Name: Michele Gilbert               "[name]" Date of Birth: [birth year]                             todays weight: [phone] Gestational Age at Delivery: [redacted]w[redacted]d           Gain of 10 ounces in 4 days Type of Delivery: vaginal del  Breastfeeding History: Frequency of Breastfeeding: none Length of Feeding:  Voids: 6-8 Stools: 3 yellow  Supplementing / Method: Pumping:  Type of Pump:Medela Symphony   Frequency:every 2-3 hours for 45 mins  Volume:  50-60 ml   Comments:  Mother in today for sore nipples . Mothers nipples are very bruised and have small cracks bilaterally. Mother states that pumping is very painful. She describes pain scale of 5-6 . Mother fears breastfeeding due to pain.  Observed very pink tissue behind nipple shaft around areola. Tissue shiny and pink. Nipple are purple in color and appear bruised. Mother ask if she has a  history of yeast.  She states she has multiple yeast infections each year and has a long history. I suspect that mother has mild case of yeast.   Consultation Evaluation: Observed mother pumping using Symphony pump. Mother using #27 flanges that were tight with little tissue movement, lots of rubbing at back of nipple shaft.Flanges were switched to #30. Mother much more comfortable with fit and pumping was tolerated well. Inst mother to dial pumping setting down if painful.  Initial Feeding Assessment: Pre-feed Weight: Post-feed Weight: Amount Transferred: Comments:  Additional Feeding Assessment: Pre-feed Weight: Post-feed Weight: Amount Transferred: Comments:  Additional Feeding Assessment: Pre-feed Weight: Post-feed Weight: Amount Transferred: Comments:  Total Breast milk Transferred this Visit:  Total Supplement Given: 50ml EBM given with bottle by father.  Additional Interventions: Mlother inst to follow up with OB via phone and request Rx for Diflucan , mother already has  APNO. Mother given inst on care of breast with yeast. Yeast inst sheet given and reviewed.  Mother inst to continue to pump and limit pumping to 25-30 mins. Every 2-3hours Reviewed application of Nipple Shield. inst mother to take a few more days to allow nipples to heal and then begin to offer breast again. Encouraged mother to do freq skin to skin with [name] while not breastfeeding.  Supplement infant with at least 60 ml every 2-3 hours.  Follow-Up  May 14 at 2:30    Stevan Born [facility] 09/09/2012, 2:39 PM

## 2012-09-14 ENCOUNTER — Ambulatory Visit (HOSPITAL_COMMUNITY)
Admission: RE | Admit: 2012-09-14 | Discharge: 2012-09-14 | Disposition: A | Discharge status: Home / Self Care | Payer: BC Managed Care – PPO | Source: Ambulatory Visit | Attending: Obstetrics & Gynecology | Admitting: Obstetrics & Gynecology

## 2012-09-14 NOTE — Lactation Note (Addendum)
Adult Lactation Consultation Outpatient Visit Note  Patient Name: Michele Gilbert Date of Birth: Dec 31, 1980 Gestational Age at Delivery: [redacted]w[redacted]d Type of Delivery:  09/02/2012 Michele Gilbert 12 days out , this is a F/U appointment from last week on  Friday 5/9 due to slow weight gain. Per mom is present ly being treated for a yeast infection , 4 days left to take Diflucan and completed nipple cream. ( see note below )  Baby has not been tx for yeast ( se note below for assessment )    Breastfeeding History: Frequency of Breastfeeding: once a day , otherwise pumping  Length of Feeding: 20 - 30 ml  Voids: >6  Stools: 2-3 or greater    Supplementing / Method:- Medela bottle  60 ml of EFM  Pumping:  Type of Pump: every 3 hours ( Medela ) -    Frequency:  Volume:  - per mom right breast - 25 -30 ml , left 12-13 ml   Comments: Per mom using the #30 flange - was given 30's last week at consult   Consultation Evaluation: Both breast are full to boarder line firm with swollen milk ducts  lateral aspects                                              - nipples and areolas pinky red and a elevated bumpy rash noted on the areolas.                                              The nipples and areolas still appear to have yeast infection present . Per mom both nipples                                              are still very sore, itchy and when the baby comes off front breast feeding they are painful, also when the baby initially                                              latches the discomfort  is present. Mom C/O shooting intermittent stabbing pain in breast and nipples . ( all s/s yeast infection                                            \ has not cleared ) . Per mom still has 4 days left on the [po diflucan and is all out of the nipple cream.                                              LC assessed baby for yeast - noted a thin white coat on tongue and bumpy raised diaper rash , and parents report baby  has been very gasey.  Initial Feeding Assessment: Pre-feed Weight: 7-6.1 oz , 3348 g  Post-feed Weight: 7-8.6 oz 3420 g  Amount Transferred: 72 ml  Comments: LC assisted and guided mom through latching baby on the right breast, until the depth was obtained per mom                     felt discomfort ( she did not request to take the baby off ) . Baby fed for 25 mins and sustained a consistent pattern.                     Increased pattern with multiply swallows and breast soften well , per mom more comfortable and nipple appeared normal                       Shape when baby released.                        Additional Feeding Assessment: Pre-feed Weight:7-8.4 oz 3412 g  Post-feed Weight: 7-9,2 oz 3436 g  Amount Transferred: 24 ml  Comments - baby awake and rooting , Mom latched baby with verbal guidance and mom did well with depth . Per mom at 1st felt discomfort and more than 1st breast.                    - Once baby obtained depth and baby was in a good pattern per mom felt much more comfortable and fed for 20 mins . Baby released suction and nipple appeared normal shape.  Total Breast milk Transferred this Visit: 96 ml ( from both breast ) - great milk transfer from the breast at 12 days out  Total Supplement Given: NONE   Lactation Plan of Care -  Praised mom for her efforts breast feeding                                          - Continue treatment for yeast infection                                          - No not use lanolin cream                                          - Follow yeast protocal sheet given at last consult and reviewed at this consult                                          - Decrease pumping flange size to #27 and if comfortable stay with the #27 and if not switch back to the #30 .                                          - feed every 3 hours and with feeding cues                                          -  Skin to skin feedings                                            - If Michele Gilbert is still hungry after the 2nd breast relatch on the 1st breast                                           - if you are too sore to latch and breast feed pump for 15-20 mins both breast and have dad feed the bottle ( 2 1/2 - 3 oz )                                           - Always soften the 1st breast before switching to the 2nd breast                                           - reviewed with mom the yeast food plan to aid on getting rid of it.    Follow-Up- 5/20 -11am Tuesday Weight check at [facility] Breast feeding support [facility] at [facility]                    - 5/22 4pm F/U Lactation appointment to check on yeast infection and weight gain and basics of latching                     - Call Dr. Seymour Bars office Thursday am and have your nipple cream prescription refilled . Also due to being symptomatic for yeast still                       probably need another round of diflucan.       Michele Gilbert 09/14/2012, 3:42 PM

## 2012-09-22 ENCOUNTER — Ambulatory Visit (HOSPITAL_COMMUNITY)
Admission: RE | Admit: 2012-09-22 | Discharge: 2012-09-22 | Disposition: A | Discharge status: Home / Self Care | Payer: BC Managed Care – PPO | Source: Ambulatory Visit | Attending: Obstetrics & Gynecology | Admitting: Obstetrics & Gynecology

## 2012-09-22 NOTE — Lactation Note (Signed)
Adult Lactation Consultation Outpatient Visit Note  Patient Name: Candace Cruise Nery(mother)  BABY: [name] Quayle Date of Birth: Dec 28, 1980                            BIRTH WEIGHT: 7-10.8 Gestational Age at Delivery: 3.3              WEIGHT TODAY: 8-4.2 Type of Delivery: NVD  Breastfeeding History: Frequency of Breastfeeding: ONCE PER DAY Length of Feeding:  Voids: QS Stools: QS  Supplementing / Method:EBM/FORMULA 2-4 OZ EVERY 2-3 HOURS Pumping:  Type of Pump:SYMPHONY   Frequency:6-8 TIMES/24 HOURS  Volume:  2-4 OZ  Comments:    Consultation Evaluation:Mom and 53 week old infant here for consultation for prolonged yeast symptoms.  Mom has been taking diflucan and treating nipples for 2 weeks but baby was just started on treatment yesterday. Mom has not been boiling pump pieces, bottles, nipple or pacifiers and instructed to do so daily.  Both nipples/areolas very bright pink and shiny.  Mom mainly pumping due to pain with latching.  Information given on gentian violet along with instructions and handout.  Also gave phone number of dermatologist, Dr Naida Sleight in [facility] who has experience treating resistant yeast infections.  Mom will explore these options and call for feeding assist when symptoms improve.  Initial Feeding Assessment:FEEDING ASSESSMENT NOT DONE Pre-feed Weight: Post-feed Weight: Amount Transferred: Comments:  Additional Feeding Assessment: Pre-feed Weight: Post-feed Weight: Amount Transferred: Comments:  Additional Feeding Assessment: Pre-feed Weight: Post-feed Weight: Amount Transferred: Comments:  Total Breast milk Transferred this Visit: N/A Total Supplement Given: N/A  Additional Interventions:   Follow-Up  WILL CALL PRN      Hansel Feinstein 09/22/2012, 4:49 PM

## 2013-04-10 ENCOUNTER — Encounter: Payer: Self-pay | Admitting: Family Medicine

## 2013-04-10 ENCOUNTER — Ambulatory Visit (INDEPENDENT_AMBULATORY_CARE_PROVIDER_SITE_OTHER): Payer: BC Managed Care – PPO | Admitting: Family Medicine

## 2013-04-10 VITALS — BP 110/70 | Temp 98.2°F | Wt 133.0 lb

## 2013-04-10 DIAGNOSIS — H109 Unspecified conjunctivitis: Secondary | ICD-10-CM

## 2013-04-10 MED ORDER — SULFACETAMIDE SODIUM 10 % OP SOLN: 1.0000 [drp] | OPHTHALMIC | Status: DC

## 2013-04-10 NOTE — Patient Instructions (Signed)
-  cool compresses several times daily  -antibiotic eye drops  -artificial saline tear drops  -change eye make up  -call eye doctor if worsening or not resolving with treatment

## 2013-04-10 NOTE — Progress Notes (Signed)
Chief Complaint  Patient presents with  . eye infeciton    reddness, burning     HPI:  Acute visit for:  ? pink eye: -started 2 days ago -symptoms: redness, irritation and drainage from both eyes -denies: vision changes, pain, fevers, sinus issues, foreign object in eye -baby had eye issues last week  ROS: See pertinent positives and negatives per HPI.  Past Medical History  Diagnosis Date  . Headache(784.0)     frequent  . Migraine   . UTI (urinary tract infection)     Past Surgical History  Procedure Laterality Date  . No past surgeries      Family History  Problem Relation Age of Onset  . Hyperlipidemia Father   . Colon cancer      grandparent  . Diabetes Other     History   Social History  . Marital Status: Married    Spouse Name: N/A    Number of Children: N/A  . Years of Education: N/A   Social History Main Topics  . Smoking status: Never Smoker   . Smokeless tobacco: None  . Alcohol Use: No  . Drug Use: No  . Sexual Activity: None   Other Topics Concern  . None   Social History Narrative  . None    Current outpatient prescriptions:acetaminophen (TYLENOL) 500 MG tablet, Take 1,000 mg by mouth every 6 (six) hours as needed for pain. For pain, Disp: , Rfl: ;  Docusate Sodium (COLACE PO), Take 1 tablet by mouth 2 (two) times daily., Disp: , Rfl: ;  doxylamine, Sleep, (UNISOM) 25 MG tablet, Take 25 mg by mouth at bedtime as needed for sleep., Disp: , Rfl:  lidocaine (XYLOCAINE) 5 % ointment, Apply 1 application topically 3 (three) times daily., Disp: , Rfl: ;  sulfacetamide (BLEPH-10) 10 % ophthalmic solution, Place 1 drop into both eyes every 3 (three) hours while awake., Disp: 15 mL, Rfl: 0  EXAM:  Filed Vitals:   04/10/13 1601  BP: 110/70  Temp: 98.2 F (36.8 C)    Body mass index is 23.57 kg/(m^2).  GENERAL: vitals reviewed and listed above, alert, oriented, appears well hydrated and in no acute distress  HEENT: atraumatic,  conjunttiva erythematous bilat, PERRLA, EOMI, visual acuity grossly intact, no obveous corneal lesion or foreign body, no obvious abnormalities on inspection of external nose and ears  NECK: no obvious masses on inspection  MS: moves all extremities without noticeable abnormality  PSYCH: pleasant and cooperative, no obvious depression or anxiety  ASSESSMENT AND PLAN:  Discussed the following assessment and plan:  Conjunctivitis - Plan: sulfacetamide (BLEPH-10) 10 % ophthalmic solution  -likely viral, but 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  Patient Instructions  -cool compresses several times daily  -antibiotic eye drops  -artificial saline tear drops  -change eye make up  -call eye doctor if worsening or not resolving with treatment     KIM, HANNAH R.

## 2013-04-11 ENCOUNTER — Telehealth: Payer: Self-pay | Admitting: Family Medicine

## 2013-04-11 NOTE — Telephone Encounter (Signed)
Pt states she was seen yesterday for conjunctivitis and was given an rx.  Pt wants to know if she can return to work on tomorrow.

## 2013-04-11 NOTE — Telephone Encounter (Signed)
As long as her work is ok with it...wash hands frequently, do not touch eyes.

## 2013-04-12 NOTE — Telephone Encounter (Signed)
Left a message making pt aware ok to return to work.

## 2013-06-02 LAB — OB RESULTS CONSOLE GBS: GBS: POSITIVE

## 2013-08-21 ENCOUNTER — Ambulatory Visit (INDEPENDENT_AMBULATORY_CARE_PROVIDER_SITE_OTHER): Payer: BC Managed Care – PPO | Admitting: Family Medicine

## 2013-08-21 ENCOUNTER — Encounter: Payer: Self-pay | Admitting: Family Medicine

## 2013-08-21 VITALS — BP 98/60 | Temp 98.3°F | Wt 127.0 lb

## 2013-08-21 DIAGNOSIS — M79672 Pain in left foot: Secondary | ICD-10-CM

## 2013-08-21 DIAGNOSIS — M79609 Pain in unspecified limb: Secondary | ICD-10-CM

## 2013-08-21 DIAGNOSIS — M79671 Pain in right foot: Secondary | ICD-10-CM

## 2013-08-21 DIAGNOSIS — I8390 Asymptomatic varicose veins of unspecified lower extremity: Secondary | ICD-10-CM

## 2013-08-21 NOTE — Patient Instructions (Addendum)
-  We placed a referral for you as discussed to the vascular doctor. It usually takes about 1-2 weeks to process and schedule this referral. If you have not heard from [place] regarding this appointment in 2 weeks please contact our office.  -wear wide toe shoe 1/2 to 1 size larger then current shoes  -follow up in 3 months

## 2013-08-21 NOTE — Progress Notes (Signed)
Chief Complaint  Patient presents with  . foot and leg discomfort    HPI:  Acute visit for L foot pain: -Started in December -first metatarsal phalangeal joints hurt when wears tight shoes  Varicose veins: -she reports both parents have had surgery for their varicose veins -bilateral and she reports these veins hurt at times and legs feel heavy sometimes -she requests a referral to vascular surgery -does get swelling in the summer in both legs  ROS: See pertinent positives and negatives per HPI.  Past Medical History  Diagnosis Date  . Headache(784.0)     frequent  . Migraine   . UTI (urinary tract infection)     Past Surgical History  Procedure Laterality Date  . No past surgeries      Family History  Problem Relation Age of Onset  . Hyperlipidemia Father   . Colon cancer      grandparent  . Diabetes Other     History   Social History  . Marital Status: Married    Spouse Name: N/A    Number of Children: N/A  . Years of Education: N/A   Social History Main Topics  . Smoking status: Never Smoker   . Smokeless tobacco: None  . Alcohol Use: No  . Drug Use: No  . Sexual Activity: None   Other Topics Concern  . None   Social History Narrative  . None    Current outpatient prescriptions:Docusate Sodium (COLACE PO), Take 1 tablet by mouth 2 (two) times daily., Disp: , Rfl:   EXAM:  Filed Vitals:   08/21/13 1518  BP: 98/60  Temp: 98.3 F (36.8 C)    Body mass index is 22.5 kg/(m^2).  GENERAL: vitals reviewed and listed above, alert, oriented, appears well hydrated and in no acute distress  HEENT: atraumatic, conjunttiva clear, no obvious abnormalities on inspection of external nose and ears  NECK: no obvious masses on inspection  LUNGS: clear to auscultation bilaterally, no wheezes, rales or rhonchi, good air movement  CV: HRRR, no peripheral edema, varicose veins both legs  MS: moves all extremities without noticeable abnormality -pes  planus with mild hallux valgus and shoe irritation to MCP bilaterally  PSYCH: pleasant and cooperative, no obvious depression or anxiety  ASSESSMENT AND PLAN:  Discussed the following assessment and plan:  Varicose veins - Plan: Ambulatory referral to Vascular Surgery -referred to vascular per her request  Foot pain, bilateral -discussed options, opted for trial in chang in foot wear and follow up in 2-3 months  -Patient advised to return or notify a doctor immediately if symptoms worsen or persist or new concerns arise.  Patient Instructions  -We placed a referral for you as discussed to the vascular doctor. It usually takes about 1-2 weeks to process and schedule this referral. If you have not heard from [place] regarding this appointment in 2 weeks please contact our office.  -wear wide toe show 1/2 to 1 size larger then current shoes  -follow up in 3 months      Michele Gilbert

## 2013-08-21 NOTE — Progress Notes (Signed)
Pre visit review using our clinic review tool, if applicable. No additional management support is needed unless otherwise documented below in the visit note. 

## 2013-10-10 LAB — OB RESULTS CONSOLE HEPATITIS B SURFACE ANTIGEN: HEP B S AG: NEGATIVE

## 2013-10-10 LAB — OB RESULTS CONSOLE RPR: RPR: NONREACTIVE

## 2013-10-10 LAB — OB RESULTS CONSOLE RUBELLA ANTIBODY, IGM: Rubella: IMMUNE

## 2013-10-10 LAB — OB RESULTS CONSOLE HIV ANTIBODY (ROUTINE TESTING): HIV: NONREACTIVE

## 2013-10-18 LAB — OB RESULTS CONSOLE GC/CHLAMYDIA
Chlamydia: NEGATIVE
GC PROBE AMP, GENITAL: NEGATIVE

## 2014-02-25 ENCOUNTER — Encounter (HOSPITAL_COMMUNITY): Payer: Self-pay

## 2014-02-25 ENCOUNTER — Inpatient Hospital Stay (HOSPITAL_COMMUNITY)
Admission: AD | Admit: 2014-02-25 | Discharge: 2014-02-25 | Disposition: A | Discharge status: Home / Self Care | Payer: BC Managed Care – PPO | Source: Ambulatory Visit | Attending: Obstetrics | Admitting: Obstetrics

## 2014-02-25 ENCOUNTER — Inpatient Hospital Stay (HOSPITAL_COMMUNITY): Payer: BC Managed Care – PPO

## 2014-02-25 DIAGNOSIS — O209 Hemorrhage in early pregnancy, unspecified: Secondary | ICD-10-CM | POA: Insufficient documentation

## 2014-02-25 DIAGNOSIS — Z3A28 28 weeks gestation of pregnancy: Secondary | ICD-10-CM | POA: Diagnosis not present

## 2014-02-25 DIAGNOSIS — K625 Hemorrhage of anus and rectum: Secondary | ICD-10-CM

## 2014-02-25 DIAGNOSIS — K5900 Constipation, unspecified: Secondary | ICD-10-CM | POA: Insufficient documentation

## 2014-02-25 DIAGNOSIS — O4693 Antepartum hemorrhage, unspecified, third trimester: Secondary | ICD-10-CM

## 2014-02-25 DIAGNOSIS — R109 Unspecified abdominal pain: Secondary | ICD-10-CM | POA: Insufficient documentation

## 2014-02-25 DIAGNOSIS — O99613 Diseases of the digestive system complicating pregnancy, third trimester: Secondary | ICD-10-CM

## 2014-02-25 NOTE — MAU Provider Note (Signed)
History     CSN: [id number]  Arrival date and time: 02/25/14 0559 Provider notified: 0630 Provider at bedside: [place]     Chief Complaint  Patient presents with  . Vaginal Bleeding  . Abdominal Pain   HPI  Ms. Zoye Chandra is a 33 yo G2P1001 @ 28.[redacted] wks gestation presenting with complaints of bleeding; unsure if vaginal bleeding.  She first noticed bright, red bleeding after wiping this morning x 1 episode.  The next time she wiped she only saw a small amount of pink on tissue.  Once arriving to [facility]U, she noticed bright, red spotting after straining with a BM.  She reports that she wiped her vaginal area and saw no blood on the tissue.  When she wiped the rectal area, she saw blood on the tissue.  (+) FM.  Her primary OB provider at [facility] is Dr. Dellis Filbert.ABO/Rh: O Positive per [facility] records.  Past Medical History  Diagnosis Date  . Headache(784.0)     frequent  . Migraine   . UTI (urinary tract infection)     Past Surgical History  Procedure Laterality Date  . No past surgeries      Family History  Problem Relation Age of Onset  . Hyperlipidemia Father   . Colon cancer      grandparent  . Diabetes Other     History  Substance Use Topics  . Smoking status: Never Smoker   . Smokeless tobacco: Not on file  . Alcohol Use: No    Allergies:  Allergies  Allergen Reactions  . Latex Dermatitis    Prescriptions prior to admission  Medication Sig Dispense Refill  . acetaminophen (TYLENOL) 325 MG tablet Take 325 mg by mouth every 6 (six) hours as needed for headache.      Mariane Baumgarten Sodium (COLACE PO) Take 1-2 tablets by mouth at bedtime as needed (for constipation).       Marland Kitchen doxylamine, Sleep, (UNISOM) 25 MG tablet Take 25 mg by mouth at bedtime as needed for sleep.         Review of Systems  Constitutional: Negative.   HENT: Negative.   Eyes: Negative.   Respiratory: Negative.   Cardiovascular: Negative.   Gastrointestinal: Positive for constipation and blood in stool.   Genitourinary:       Lower abd cramping  Musculoskeletal: Negative.   Skin: Negative.   Neurological: Negative.   Endo/Heme/Allergies: Negative.   Psychiatric/Behavioral: Negative.    FHR: 135 / moderate variability / accels present / no decels Toco: 2 UC's noted with UI  OB Limited Ultrasound: No placenta anomalies, anterior above the cervical os / AFI WNL / breech / CL 3.6 cm  Physical Exam   Blood pressure 101/56, pulse 70, resp. rate 18, height 5\' 3"  (1.6 m), weight 66.951 kg (147 lb 9.6 oz).  Physical Exam  Constitutional: She is oriented to person, place, and time. She appears well-developed and well-nourished.  HENT:  Head: Normocephalic and atraumatic.  Eyes: Pupils are equal, round, and reactive to light.  Neck: Normal range of motion. Neck supple.  Cardiovascular: Normal rate, regular rhythm and normal heart sounds.   Respiratory: Effort normal and breath sounds normal.  GI: Soft. Bowel sounds are normal.  Genitourinary: Vagina normal and uterus normal.  No VB; scant amount of thick, white vaginal d/c  Musculoskeletal: Normal range of motion.  Neurological: She is alert and oriented to person, place, and time. She has normal reflexes.  Skin: Skin is warm and dry.  Psychiatric: She has a normal mood and affect. Her behavior is normal. Judgment and thought content normal.    MAU Course  Procedures OB Limited Ultrasound - check placenta Assessment and Plan  33 yo G2P1001 @ 28.[redacted] wks gestation Lower abdominal pain; third trimester Constipation Rectal Bleeding  Discharge Home Colace 1-2 tabs TID Increase water intake High fiber diet Tylenol 1000 mg / soak in warm bath / rest once home Keep scheduled appointment with Dr. Morrie Sheldon MSN, CNM 02/25/2014, 9:01 AM

## 2014-02-25 NOTE — Discharge Instructions (Signed)
Constipation °Constipation is when a person has fewer than three bowel movements a week, has difficulty having a bowel movement, or has stools that are dry, hard, or larger than normal. As people grow older, constipation is more common. If you try to fix constipation with medicines that make you have a bowel movement (laxatives), the problem may get worse. Long-term laxative use may cause the muscles of the colon to become weak. A low-fiber diet, not taking in enough fluids, and taking certain medicines may make constipation worse.  °CAUSES  °· Certain medicines, such as antidepressants, pain medicine, iron supplements, antacids, and water pills.   °· Certain diseases, such as diabetes, irritable bowel syndrome (IBS), thyroid disease, or depression.   °· Not drinking enough water.   °· Not eating enough fiber-rich foods.   °· Stress or travel.   °· Lack of physical activity or exercise.   °· Ignoring the urge to have a bowel movement.   °· Using laxatives too much.   °SIGNS AND SYMPTOMS  °· Having fewer than three bowel movements a week.   °· Straining to have a bowel movement.   °· Having stools that are hard, dry, or larger than normal.   °· Feeling full or bloated.   °· Pain in the lower abdomen.   °· Not feeling relief after having a bowel movement.   °DIAGNOSIS  °Your health care provider will take a medical history and perform a physical exam. Further testing may be done for severe constipation. Some tests may include: °· A barium enema X-ray to examine your rectum, colon, and, sometimes, your small intestine.   °· A sigmoidoscopy to examine your lower colon.   °· A colonoscopy to examine your entire colon. °TREATMENT  °Treatment will depend on the severity of your constipation and what is causing it. Some dietary treatments include drinking more fluids and eating more fiber-rich foods. Lifestyle treatments may include regular exercise. If these diet and lifestyle recommendations do not help, your health care  provider may recommend taking over-the-counter laxative medicines to help you have bowel movements. Prescription medicines may be prescribed if over-the-counter medicines do not work.  °HOME CARE INSTRUCTIONS  °· Eat foods that have a lot of fiber, such as fruits, vegetables, whole grains, and beans. °· Limit foods high in fat and processed sugars, such as french fries, hamburgers, cookies, candies, and soda.   °· A fiber supplement may be added to your diet if you cannot get enough fiber from foods.   °· Drink enough fluids to keep your urine clear or pale yellow.   °· Exercise regularly or as directed by your health care provider.   °· Go to the restroom when you have the urge to go. Do not hold it.   °· Only take over-the-counter or prescription medicines as directed by your health care provider. Do not take other medicines for constipation without talking to your health care provider first.   °SEEK IMMEDIATE MEDICAL CARE IF:  °· You have bright red blood in your stool.   °· Your constipation lasts for more than 4 days or gets worse.   °· You have abdominal or rectal pain.   °· You have thin, pencil-like stools.   °· You have unexplained weight loss. °MAKE SURE YOU:  °· Understand these instructions. °· Will watch your condition. °· Will get help right away if you are not doing well or get worse. °Document Released: 01/17/2004 Document Revised: 04/25/2013 Document Reviewed: 01/30/2013 °[employer]® Patient Information ©2015 [employer]. This information is not intended to replace advice given to you by your health care provider. Make sure you discuss any questions   you have with your health care provider.  Rectal Bleeding  Rectal bleeding is when blood comes out of the opening of the butt (anus). Rectal bleeding may show up as bright red blood or really dark poop (stool). The poop may look dark red, maroon, or black. Rectal bleeding is often a sign that something is wrong. This needs to be checked by a doctor.    HOME CARE  Eat a diet high in fiber. This will help keep your poop soft.  Limit activity.  Drink enough fluids to keep your pee (urine) clear or pale yellow.  Take a warm bath to soothe any pain.  Follow up with your doctor as told. GET HELP RIGHT AWAY IF:  You have more bleeding.  You have black or dark red poop.  You throw up (vomit) blood or it looks like coffee grounds.  You have belly (abdominal) pain or tenderness.  You have a fever.  You feel weak, sick to your stomach (nauseous), or you pass out (faint).  You have pain that is so bad you cannot poop (bowel movement). MAKE SURE YOU:  Understand these instructions.  Will watch your condition.  Will get help right away if you are not doing well or get worse. Document Released: 12/31/2010 Document Revised: 09/04/2013 Document Reviewed: 12/31/2010 [facility] Patient Information 2015 [place], [place]. This information is not intended to replace advice given to you by your health care provider. Make sure you discuss any questions you have with your health care provider. Abdominal Pain During Pregnancy Abdominal pain is common in pregnancy. Most of the time, it does not cause harm. There are many causes of abdominal pain. Some causes are more serious than others. Some of the causes of abdominal pain in pregnancy are easily diagnosed. Occasionally, the diagnosis takes time to understand. Other times, the cause is not determined. Abdominal pain can be a sign that something is very wrong with the pregnancy, or the pain may have nothing to do with the pregnancy at all. For this reason, always tell your health care provider if you have any abdominal discomfort. HOME CARE INSTRUCTIONS  Monitor your abdominal pain for any changes. The following actions may help to alleviate any discomfort you are experiencing: Do not have sexual intercourse or put anything in your vagina until your symptoms go away completely. Get plenty of rest until  your pain improves. Drink clear fluids if you feel nauseous. Avoid solid food as long as you are uncomfortable or nauseous. Only take over-the-counter or prescription medicine as directed by your health care provider. Keep all follow-up appointments with your health care provider. SEEK IMMEDIATE MEDICAL CARE IF: You are bleeding, leaking fluid, or passing tissue from the vagina. You have increasing pain or cramping. You have persistent vomiting. You have painful or bloody urination. You have a fever. You notice a decrease in your baby's movements. You have extreme weakness or feel faint. You have shortness of breath, with or without abdominal pain. You develop a severe headache with abdominal pain. You have abnormal vaginal discharge with abdominal pain. You have persistent diarrhea. You have abdominal pain that continues even after rest, or gets worse. MAKE SURE YOU:  Understand these instructions. Will watch your condition. Will get help right away if you are not doing well or get worse. Document Released: 04/20/2005 Document Revised: 02/08/2013 Document Reviewed: 11/17/2012 [facility] Patient Information 2015 [place], [place]. This information is not intended to replace advice given to you by your health care provider. Make sure  you discuss any questions you have with your health care provider.

## 2014-02-25 NOTE — MAU Note (Signed)
Felt some ctx Fri and Sat night. Went to [facility] and had BM and then noticed a lot of bright blood on toilet paper. Went back to [facility] alittle later and saw pink on tissue. Does have hemorrhoids. Baby moving a lot. Some cramping

## 2014-03-05 ENCOUNTER — Encounter (HOSPITAL_COMMUNITY): Payer: Self-pay

## 2014-05-02 LAB — OB RESULTS CONSOLE GBS: GBS: POSITIVE

## 2014-05-04 NOTE — L&D Delivery Note (Signed)
Delivery Note viable female was delivered via Vaginal, Spontaneous Delivery (Presentation: Right Occiput Anterior).  APGAR: 9/9, ; weight  Pending.  40 units of pitocin diluted in 1000cc LR was infused rapidly IV.  The placenta separated spontaneously and delivered via CCT and maternal pushing effort.  It was inspected and appears to be intact with a 3 VC.  Marland Kitchen   Anesthesia: None  Episiotomy: None Lacerations:  2nd degree perineal Suture Repair: 2-0 rapide Est. Blood Loss (mL):  150 Mom to postpartum.  Baby to Couplet care / Skin to Skin.

## 2014-05-10 ENCOUNTER — Encounter (HOSPITAL_COMMUNITY): Payer: Self-pay | Admitting: *Deleted

## 2014-05-10 ENCOUNTER — Inpatient Hospital Stay (HOSPITAL_COMMUNITY)
Admission: AD | Admit: 2014-05-10 | Discharge: 2014-05-13 | DRG: 775 | Disposition: A | Discharge status: Home / Self Care | Payer: 59 | Source: Ambulatory Visit | Attending: Obstetrics and Gynecology | Admitting: Obstetrics and Gynecology

## 2014-05-10 DIAGNOSIS — Z3A39 39 weeks gestation of pregnancy: Secondary | ICD-10-CM | POA: Diagnosis present

## 2014-05-10 DIAGNOSIS — O9902 Anemia complicating childbirth: Secondary | ICD-10-CM | POA: Diagnosis present

## 2014-05-10 DIAGNOSIS — D62 Acute posthemorrhagic anemia: Secondary | ICD-10-CM | POA: Diagnosis present

## 2014-05-10 DIAGNOSIS — O99824 Streptococcus B carrier state complicating childbirth: Secondary | ICD-10-CM | POA: Diagnosis present

## 2014-05-10 DIAGNOSIS — O4103X0 Oligohydramnios, third trimester, not applicable or unspecified: Secondary | ICD-10-CM | POA: Diagnosis present

## 2014-05-10 DIAGNOSIS — Z833 Family history of diabetes mellitus: Secondary | ICD-10-CM | POA: Diagnosis not present

## 2014-05-10 DIAGNOSIS — O4100X0 Oligohydramnios, unspecified trimester, not applicable or unspecified: Secondary | ICD-10-CM | POA: Diagnosis present

## 2014-05-10 HISTORY — DX: Constipation, unspecified: K59.00

## 2014-05-10 HISTORY — DX: Mastitis without abscess: N61.0

## 2014-05-10 LAB — CBC
HCT: 31.4 % — ABNORMAL LOW (ref 36.0–46.0)
HEMOGLOBIN: 9.8 g/dL — AB (ref 12.0–15.0)
MCH: 22.4 pg — ABNORMAL LOW (ref 26.0–34.0)
MCHC: 31.2 g/dL (ref 30.0–36.0)
MCV: 71.7 fL — ABNORMAL LOW (ref 78.0–100.0)
Platelets: 262 10*3/uL (ref 150–400)
RBC: 4.38 MIL/uL (ref 3.87–5.11)
RDW: 16.8 % — ABNORMAL HIGH (ref 11.5–15.5)
WBC: 10.2 10*3/uL (ref 4.0–10.5)

## 2014-05-10 LAB — TYPE AND SCREEN
ABO/RH(D): O POS
Antibody Screen: NEGATIVE

## 2014-05-10 LAB — RPR

## 2014-05-10 MED ORDER — CITRIC ACID-SODIUM CITRATE 334-500 MG/5ML PO SOLN: 30.0000 mL | ORAL | Status: DC | PRN

## 2014-05-10 MED ORDER — MISOPROSTOL 25 MCG QUARTER TABLET
25.0000 ug | ORAL_TABLET | Freq: Four times a day (QID) | ORAL | Status: DC
  Administered 2014-05-10: 25 ug via VAGINAL
  Filled 2014-05-10: qty 0.25

## 2014-05-10 MED ORDER — LACTATED RINGERS IV SOLN
INTRAVENOUS | Status: DC
  Administered 2014-05-10 (×2): via INTRAVENOUS

## 2014-05-10 MED ORDER — FLEET ENEMA 7-19 GM/118ML RE ENEM: 1.0000 | ENEMA | Freq: Once | RECTAL | Status: DC

## 2014-05-10 MED ORDER — ACETAMINOPHEN 325 MG PO TABS
650.0000 mg | ORAL_TABLET | ORAL | Status: DC | PRN
  Administered 2014-05-10: 650 mg via ORAL
  Filled 2014-05-10: qty 2

## 2014-05-10 MED ORDER — OXYCODONE-ACETAMINOPHEN 5-325 MG PO TABS: 2.0000 | ORAL_TABLET | ORAL | Status: DC | PRN

## 2014-05-10 MED ORDER — LACTATED RINGERS IV SOLN: 500.0000 mL | INTRAVENOUS | Status: DC | PRN

## 2014-05-10 MED ORDER — OXYCODONE-ACETAMINOPHEN 5-325 MG PO TABS
1.0000 | ORAL_TABLET | ORAL | Status: DC | PRN
  Administered 2014-05-11: 1 via ORAL
  Filled 2014-05-10: qty 1

## 2014-05-10 MED ORDER — PENICILLIN G POTASSIUM 5000000 UNITS IJ SOLR
2.5000 10*6.[IU] | INTRAVENOUS | Status: DC
  Administered 2014-05-10 – 2014-05-11 (×4): 2.5 10*6.[IU] via INTRAVENOUS
  Filled 2014-05-10 (×7): qty 2.5

## 2014-05-10 MED ORDER — OXYTOCIN BOLUS FROM INFUSION: 500.0000 mL | INTRAVENOUS | Status: DC

## 2014-05-10 MED ORDER — ONDANSETRON HCL 4 MG/2ML IJ SOLN: 4.0000 mg | Freq: Four times a day (QID) | INTRAMUSCULAR | Status: DC | PRN

## 2014-05-10 MED ORDER — LIDOCAINE HCL (PF) 1 % IJ SOLN
30.0000 mL | INTRAMUSCULAR | Status: DC | PRN
  Filled 2014-05-10: qty 30

## 2014-05-10 MED ORDER — PENICILLIN G POTASSIUM 5000000 UNITS IJ SOLR
5.0000 10*6.[IU] | Freq: Once | INTRAVENOUS | Status: AC
  Administered 2014-05-10: 5 10*6.[IU] via INTRAVENOUS
  Filled 2014-05-10: qty 5

## 2014-05-10 MED ORDER — OXYTOCIN 40 UNITS IN LACTATED RINGERS INFUSION - SIMPLE MED
62.5000 mL/h | INTRAVENOUS | Status: DC
  Filled 2014-05-10: qty 1000

## 2014-05-10 NOTE — MAU Note (Addendum)
Pt was sent from the office today after VE 3-4/70/-1.  Known positive GBS.  Pt is direct admit but will be in MAU until birthing room is available.  Pt states her contractions are about every 20 min.  Denies ROM.  Passed one small clot in toilet when having a bowel movement in MAU since VE in office.  Good fetal movement.

## 2014-05-10 NOTE — H&P (Signed)
Michele Gilbert is a 34 y.o. G2P1001 at [redacted]w[redacted]d presenting for labor augmentation in early labor due to olgiohydramnios. Pt notes not feeling any contractions. Good fetal movement, vaginal bleeding, light, that started this am which precipitated u/s which showed oligo with an AFI of 4.7. Pt does not not  leaking fluid.  [facility] at [place] since 7 wks - Dated by LMP c/w 7 wk u/s - GBS pos - Fetal testing. U/s today at 39'0, EFW 88%, vtx, BPP 8/8, AFi 4.7    Prenatal Transfer Tool  Maternal Diabetes: No Genetic Screening: Normal Maternal Ultrasounds/Referrals: Normal Fetal Ultrasounds or other Referrals:  None Maternal Substance Abuse:  No Significant Maternal Medications:  None Significant Maternal Lab Results: None     OB History    Gravida Para Term Preterm AB TAB SAB Ectopic Multiple Living   2 1 1       1      Past Medical History  Diagnosis Date  . Headache(784.0)     frequent  . Migraine   . UTI (urinary tract infection)   . Constipation    Past Surgical History  Procedure Laterality Date  . No past surgeries     Family History: family history includes Colon cancer in an other family member; Diabetes in her other; Hyperlipidemia in her father. Social History:  reports that she has never smoked. She does not have any smokeless tobacco history on file. She reports that she does not drink alcohol or use illicit drugs.  Review of Systems - Negative except vaginal bleeding   Exam by:: Chelsea Nusz Pulse 88, temperature 98.3 F (36.8 C), temperature source Oral, resp. rate 16, SpO2 98 %, not currently breastfeeding.  Physical Exam:  Gen: well appearing, no distress  Back: no CVAT Abd: gravid, NT, no RUQ pain LE: no edema, equal bilaterally, non-tender Toco: q 10 min FH: baseline 130s, accelerations present, no deceleratons, 10 beat variability GU: per ML this am: 3/70%/ vtx/ -1. Repeat exam by me: cvx very posterior and unable to access, soft, vtx  Prenatal  labs: ABO, Rh: --/--/O POS (01/07 1150) Antibody: NEG (01/07 1150) Rubella:  immune RPR:   NR HBsAg:   neg HIV:   neg GBS: Positive (12/30 0000)  1 hr Glucola 133  Genetic screening nl NT, nl AFP Anatomy US nl   Assessment/Plan: 34 y.o. G2P1001 at [redacted]w[redacted]d Third trimester vaginal spotting with oligohydramnios. Reactive fetal testing and ripe cervix, recc proceed with augmentation of labor. DDx include SROM, latent labor with bleeding from cervical change, placental insufficiency, small abruption. Maternal and fetal status reassuring. If increased bleeding, add coag's. Plan pitocin when labor room available.  - GBS pos. PCN -    Michele Gilbert A. 05/10/2014, 2:21 PM

## 2014-05-11 ENCOUNTER — Encounter (HOSPITAL_COMMUNITY): Payer: Self-pay | Admitting: *Deleted

## 2014-05-11 DIAGNOSIS — O9902 Anemia complicating childbirth: Secondary | ICD-10-CM

## 2014-05-11 DIAGNOSIS — D62 Acute posthemorrhagic anemia: Secondary | ICD-10-CM

## 2014-05-11 DIAGNOSIS — O4103X0 Oligohydramnios, third trimester, not applicable or unspecified: Secondary | ICD-10-CM

## 2014-05-11 MED ORDER — DIPHENHYDRAMINE HCL 25 MG PO CAPS: 25.0000 mg | ORAL_CAPSULE | Freq: Four times a day (QID) | ORAL | Status: DC | PRN

## 2014-05-11 MED ORDER — SIMETHICONE 80 MG PO CHEW: 80.0000 mg | CHEWABLE_TABLET | ORAL | Status: DC | PRN

## 2014-05-11 MED ORDER — OXYCODONE-ACETAMINOPHEN 5-325 MG PO TABS: 2.0000 | ORAL_TABLET | ORAL | Status: DC | PRN

## 2014-05-11 MED ORDER — FENTANYL 2.5 MCG/ML BUPIVACAINE 1/10 % EPIDURAL INFUSION (WH - ANES): 14.0000 mL/h | INTRAMUSCULAR | Status: DC | PRN

## 2014-05-11 MED ORDER — PHENYLEPHRINE 40 MCG/ML (10ML) SYRINGE FOR IV PUSH (FOR BLOOD PRESSURE SUPPORT)
80.0000 ug | PREFILLED_SYRINGE | INTRAVENOUS | Status: DC | PRN
  Filled 2014-05-11: qty 2

## 2014-05-11 MED ORDER — LANOLIN HYDROUS EX OINT: TOPICAL_OINTMENT | CUTANEOUS | Status: DC | PRN

## 2014-05-11 MED ORDER — ZOLPIDEM TARTRATE 5 MG PO TABS
5.0000 mg | ORAL_TABLET | Freq: Every evening | ORAL | Status: DC | PRN
  Administered 2014-05-11: 5 mg via ORAL
  Filled 2014-05-11: qty 1

## 2014-05-11 MED ORDER — ONDANSETRON HCL 4 MG PO TABS: 4.0000 mg | ORAL_TABLET | ORAL | Status: DC | PRN

## 2014-05-11 MED ORDER — ONDANSETRON HCL 4 MG/2ML IJ SOLN: 4.0000 mg | INTRAMUSCULAR | Status: DC | PRN

## 2014-05-11 MED ORDER — IBUPROFEN 600 MG PO TABS
600.0000 mg | ORAL_TABLET | Freq: Four times a day (QID) | ORAL | Status: DC
  Administered 2014-05-11 – 2014-05-13 (×8): 600 mg via ORAL
  Filled 2014-05-11 (×8): qty 1

## 2014-05-11 MED ORDER — EPHEDRINE 5 MG/ML INJ
10.0000 mg | INTRAVENOUS | Status: DC | PRN
  Filled 2014-05-11: qty 2

## 2014-05-11 MED ORDER — METHYLERGONOVINE MALEATE 0.2 MG/ML IJ SOLN: 0.2000 mg | INTRAMUSCULAR | Status: DC | PRN

## 2014-05-11 MED ORDER — POLYSACCHARIDE IRON COMPLEX 150 MG PO CAPS
150.0000 mg | ORAL_CAPSULE | Freq: Every day | ORAL | Status: DC
  Administered 2014-05-11 – 2014-05-13 (×3): 150 mg via ORAL
  Filled 2014-05-11 (×3): qty 1

## 2014-05-11 MED ORDER — SENNOSIDES-DOCUSATE SODIUM 8.6-50 MG PO TABS
2.0000 | ORAL_TABLET | ORAL | Status: DC
  Administered 2014-05-11 – 2014-05-13 (×2): 2 via ORAL
  Filled 2014-05-11 (×2): qty 2

## 2014-05-11 MED ORDER — BENZOCAINE-MENTHOL 20-0.5 % EX AERO
1.0000 "application " | INHALATION_SPRAY | CUTANEOUS | Status: DC | PRN
  Administered 2014-05-11: 1 via TOPICAL
  Filled 2014-05-11 (×2): qty 56

## 2014-05-11 MED ORDER — IBUPROFEN 600 MG PO TABS
600.0000 mg | ORAL_TABLET | Freq: Four times a day (QID) | ORAL | Status: DC
  Administered 2014-05-11: 600 mg via ORAL
  Filled 2014-05-11: qty 1

## 2014-05-11 MED ORDER — WITCH HAZEL-GLYCERIN EX PADS: 1.0000 "application " | MEDICATED_PAD | CUTANEOUS | Status: DC | PRN

## 2014-05-11 MED ORDER — METHYLERGONOVINE MALEATE 0.2 MG PO TABS: 0.2000 mg | ORAL_TABLET | ORAL | Status: DC | PRN

## 2014-05-11 MED ORDER — SENNOSIDES-DOCUSATE SODIUM 8.6-50 MG PO TABS: 2.0000 | ORAL_TABLET | ORAL | Status: DC

## 2014-05-11 MED ORDER — DIBUCAINE 1 % RE OINT: 1.0000 "application " | TOPICAL_OINTMENT | RECTAL | Status: DC | PRN

## 2014-05-11 MED ORDER — TETANUS-DIPHTH-ACELL PERTUSSIS 5-2.5-18.5 LF-MCG/0.5 IM SUSP: 0.5000 mL | Freq: Once | INTRAMUSCULAR | Status: DC

## 2014-05-11 MED ORDER — BENZOCAINE-MENTHOL 20-0.5 % EX AERO: 1.0000 "application " | INHALATION_SPRAY | CUTANEOUS | Status: DC | PRN

## 2014-05-11 MED ORDER — ZOLPIDEM TARTRATE 5 MG PO TABS: 5.0000 mg | ORAL_TABLET | Freq: Every evening | ORAL | Status: DC | PRN

## 2014-05-11 MED ORDER — DIPHENHYDRAMINE HCL 50 MG/ML IJ SOLN: 12.5000 mg | INTRAMUSCULAR | Status: DC | PRN

## 2014-05-11 MED ORDER — OXYCODONE-ACETAMINOPHEN 5-325 MG PO TABS: 1.0000 | ORAL_TABLET | ORAL | Status: DC | PRN

## 2014-05-11 MED ORDER — OXYCODONE-ACETAMINOPHEN 5-325 MG PO TABS
1.0000 | ORAL_TABLET | ORAL | Status: DC | PRN
  Administered 2014-05-11 – 2014-05-12 (×3): 1 via ORAL
  Filled 2014-05-11 (×4): qty 1

## 2014-05-11 MED ORDER — ONDANSETRON HCL 4 MG/2ML IJ SOLN
4.0000 mg | INTRAMUSCULAR | Status: DC | PRN
Start: 2014-05-11 — End: 2014-05-13

## 2014-05-11 MED ORDER — LACTATED RINGERS IV SOLN: 500.0000 mL | Freq: Once | INTRAVENOUS | Status: DC

## 2014-05-11 MED ORDER — PRENATAL MULTIVITAMIN CH
1.0000 | ORAL_TABLET | Freq: Every day | ORAL | Status: DC
  Administered 2014-05-11 – 2014-05-13 (×3): 1 via ORAL
  Filled 2014-05-11 (×3): qty 1

## 2014-05-11 MED ORDER — MAGNESIUM OXIDE 400 (241.3 MG) MG PO TABS
400.0000 mg | ORAL_TABLET | Freq: Every day | ORAL | Status: DC
Start: 2014-05-11 — End: 2014-05-13
  Administered 2014-05-11 – 2014-05-13 (×3): 400 mg via ORAL
  Filled 2014-05-11 (×4): qty 1

## 2014-05-11 NOTE — Lactation Note (Signed)
This note was copied from the chart of Michele Gilbert. Lactation Consultation Note  Patient Name: Michele Gilbert OLIDC'V Date: 05/11/2014 Reason for consult: Initial assessment Baby 7 hours of life. LC called to room by patient's nurse. Mom states that she had painful latching with first child, then yeast and mastitis. Mom states that she pumped and bottle-fed first child during the BF issues and then baby would not return to breast afterwards. Mom concerned that she is already having nipple soreness after first BF with this infant. Attempted to assist mom to latch baby in football position, baby too sleepy to latch. Discussed normal newborn infant behavior. Enc mom to offer STS and attempt to nurse with cues. Discussed positioning and breast compression to achieve a deep latch and the need to maintain a deep latch to prevent nipple damage. Mom return-demonstrated hand expression with colostrum visible. Enc mom to use colostrum on nipples for healing and soreness.   Mom given [facility] brochure, aware of OP/BFSG, community resources, and [facility] phone line assistance after D/C. Enc mom to call out for assistance with latching as needed. Discussed cluster-feeding and enc nursing with cues and at least 8-12 times/24 hours.   Maternal Data Has patient been taught Hand Expression?: Yes Does the patient have breastfeeding experience prior to this delivery?: Yes  Feeding Feeding Type: Breast Fed Length of feed: 0 min  LATCH Score/Interventions Latch: Too sleepy or reluctant, no latch achieved, no sucking elicited. Intervention(s): Skin to skin;Waking techniques  Audible Swallowing: None  Type of Nipple: Everted at rest and after stimulation  Comfort (Breast/Nipple): Soft / non-tender     Hold (Positioning): Assistance needed to correctly position infant at breast and maintain latch. Intervention(s): Breastfeeding basics reviewed;Support Pillows;Position options  LATCH Score: 5  Lactation Tools  Discussed/Used     Consult Status Consult Status: Follow-up Date: 05/12/14 Follow-up type: In-patient    Inocente Salles 05/11/2014, 1:59 PM

## 2014-05-12 LAB — CBC
HCT: 29 % — ABNORMAL LOW (ref 36.0–46.0)
Hemoglobin: 9.1 g/dL — ABNORMAL LOW (ref 12.0–15.0)
MCH: 22.6 pg — AB (ref 26.0–34.0)
MCHC: 31.4 g/dL (ref 30.0–36.0)
MCV: 72 fL — ABNORMAL LOW (ref 78.0–100.0)
Platelets: 223 10*3/uL (ref 150–400)
RBC: 4.03 MIL/uL (ref 3.87–5.11)
RDW: 17.1 % — ABNORMAL HIGH (ref 11.5–15.5)
WBC: 10.9 10*3/uL — ABNORMAL HIGH (ref 4.0–10.5)

## 2014-05-12 NOTE — Progress Notes (Signed)
Patient ID: Michele Gilbert, female   DOB: [birth year], 34 y.o.   MRN: [id number] PPD # 1 SVD  S:  Reports feeling a little sore and tired             Tolerating po/ No nausea or vomiting             Bleeding is light             Pain controlled with ibuprofen (OTC) and narcotic analgesics including Percocet - last dose of Percocet was last evening             Up ad lib / ambulatory / voiding without difficulties    Newborn  Information for the patient's newborn:  Michele Gilbert, Michele Gilbert [[id number]]  female  breast feeding with some latch issues causing very sore nipples - wearing comfort gels now / lactation assistance required for alternative holds   O:  A & O x 3, in no apparent distress              VS:  Filed Vitals:   05/11/14 0930 05/11/14 1400 05/11/14 1500 05/12/14 0602  BP: 107/51 98/55  101/59  Pulse: 50 81  76  Temp: 98.3 F (36.8 C)  98.7 F (37.1 C) 98.1 F (36.7 C)  TempSrc: Oral Oral  Oral  Resp: 18 18  18   Height:      Weight:      SpO2: 98% 99%      LABS:  Recent Labs  05/10/14 1150 05/12/14 0551  WBC 10.2 10.9*  HGB 9.8* 9.1*  HCT 31.4* 29.0*  PLT 262 223    Blood type: O POS (01/07 1150)  Rubella: Immune (06/09 0000)   I&O: I/O last 3 completed shifts: In: -  Out: 150 [Blood:150]             Lungs: Clear and unlabored  Heart: regular rate and rhythm / no murmurs  Abdomen: soft, non-tender, non-distended              Fundus: firm, non-tender, U-1  Perineum: 2nd degree repair healing well, mild edema - ice pack in place  Lochia: minimal  Extremities: trace edema, no calf pain or tenderness, no Homans    A/P: PPD # 1  34 y.o., [id number]   Principal Problem:    Postpartum care following vaginal delivery (1/8)  Active Problems:    Oligohydramnios antepartum  IDA with compounding ABL Anemia   Doing well - stable status  Routine post partum orders  Continue Niferex and Magnesium Oxide  Continue using comfort gels / will prescribe Dr. Pollie Friar  All-Purpose Nipple Cream for d/c home  D/C using ice pack and rely on ibuprofen to decrease swelling in vaginal area  Push water intake to help decrease swelling in BLE  Anticipate discharge tomorrow    Laury Deep, M, MSN, CNM 05/12/2014, 3:04 PM

## 2014-05-12 NOTE — Lactation Note (Signed)
This note was copied from the chart of Michele Rechel Delosreyes. Lactation Consultation Note: Mom complains of very sore nipples- has been formula feeding with a syringe because her nipples were so sore. Offered assist with latch. Mom agreeable. Baby undressed and awakened, Latched well, Mom reports pain of 3.5/10 with latch.but eases off. Had lots of pain, thrush and mastitis with first baby. Encouraged to watch for feeding cues and begin feeding as soon as possible before baby gets too frantic. Wearing comfort gels. Asking about using lanolin. Encouraged not to use due to history of yeast.No further questions at present. To call for assist prn  Patient Name: Michele Gilbert [id number] Date: 05/12/2014 Reason for consult: Follow-up assessment   Maternal Data    Feeding   LATCH Score/Interventions Latch: Grasps breast easily, tongue down, lips flanged, rhythmical sucking.  Audible Swallowing: A few with stimulation  Type of Nipple: Everted at rest and after stimulation  Comfort (Breast/Nipple): Filling, red/small blisters or bruises, mild/mod discomfort  Problem noted: Mild/Moderate discomfort Interventions (Mild/moderate discomfort): Comfort gels;Hand expression  Hold (Positioning): Assistance needed to correctly position infant at breast and maintain latch. Intervention(s): Breastfeeding basics reviewed;Support Pillows;Skin to skin  LATCH Score: 7  Lactation Tools Discussed/Used     Consult Status Consult Status: Follow-up Date: 05/13/14 Follow-up type: In-patient    Truddie Crumble 05/12/2014, 2:36 PM

## 2014-05-13 MED ORDER — BENZOCAINE-MENTHOL 20-0.5 % EX AERO: 1.0000 "application " | INHALATION_SPRAY | CUTANEOUS | Status: DC | PRN

## 2014-05-13 MED ORDER — OXYCODONE-ACETAMINOPHEN 5-325 MG PO TABS: 1.0000 | ORAL_TABLET | ORAL | Status: DC | PRN

## 2014-05-13 MED ORDER — POLYSACCHARIDE IRON COMPLEX 150 MG PO CAPS: 150.0000 mg | ORAL_CAPSULE | Freq: Every day | ORAL | Status: DC

## 2014-05-13 MED ORDER — IBUPROFEN 600 MG PO TABS: 600.0000 mg | ORAL_TABLET | Freq: Four times a day (QID) | ORAL | Status: DC

## 2014-05-13 MED ORDER — MAGNESIUM OXIDE 400 (241.3 MG) MG PO TABS: 400.0000 mg | ORAL_TABLET | Freq: Every day | ORAL | Status: DC

## 2014-05-13 NOTE — Progress Notes (Signed)
Patient ID: Michele Gilbert, female   DOB: 09-09-1980, 34 y.o.   MRN: [id number] PPD # 2 SVD  S:  Reports feeling well             Tolerating po/ No nausea or vomiting             Bleeding is light             Pain controlled with ibuprofen (OTC) and narcotic analgesics including Percocet - last dose of Percocet was last evening             Up ad lib / ambulatory / voiding without difficulties    Newborn  Information for the patient's newborn:  [name], Mood [[id number]]  female   breast feeding with some latch issues with worsening sore nipples - using comfort gels  / lactation assistance needed prior to d/c home   O:  A & O x 3, in no apparent distress              VS:  Filed Vitals:   05/11/14 1500 05/12/14 0602 05/12/14 1811 05/13/14 0635  BP:  101/59 110/54 107/52  Pulse:  76 75 70  Temp: 98.7 F (37.1 C) 98.1 F (36.7 C) 98.3 F (36.8 C) 98 F (36.7 C)  TempSrc:  Oral Oral   Resp:  18 18 18   Height:      Weight:      SpO2:        LABS:   Recent Labs  05/10/14 1150 05/12/14 0551  WBC 10.2 10.9*  HGB 9.8* 9.1*  HCT 31.4* 29.0*  PLT 262 223    Blood type: O POS (01/07 1150)  Rubella: Immune (06/09 0000)    Lungs: Clear and unlabored  Heart: regular rate and rhythm / no murmurs  Abdomen: soft, non-tender, non-distended              Fundus: firm, non-tender, U-2  Perineum: 2nd degree repair healing well, mild edema   Lochia: minimal  Extremities: no edema, no calf pain or tenderness, no Homans    A/P: PPD # 2  34 y.o., [id number]   Principal Problem:    Postpartum care following vaginal delivery (1/8)  Active Problems:    Oligohydramnios antepartum  IDA with compounding ABL Anemia   Doing well - stable status  Routine post partum orders  Continue Niferex and Magnesium Oxide  Continue using comfort gels / Dr. Pollie Friar All-Purpose Nipple Cream - to be picked up at [facility]  Ibuprofen and sitz bath to decrease swelling in vaginal  area  Anticipate discharge tomorrow    Laury Deep, M, MSN, CNM 05/13/2014, 9:48 AM

## 2014-05-13 NOTE — Discharge Instructions (Signed)
Breast Pumping Tips °If you are breastfeeding, there may be times when you cannot feed your baby directly. Returning to work or going on a trip are common examples. Pumping allows you to store breast milk and feed it to your baby later.  °You may not get much milk when you first start to pump. Your breasts should start to make more after a few days. If you pump at the times you usually feed your baby, you may be able to keep making enough milk to feed your baby without also using formula. The more often you pump, the more milk you will produce.  °WHEN SHOULD I PUMP?  °· You can begin to pump soon after delivery. However, some experts recommend waiting about 4 weeks before giving your infant a bottle to make sure breastfeeding is going well.  °· If you plan to return to work, begin pumping a few weeks before. This will help you develop techniques that work best for you. It also lets you build up a supply of breast milk.   °· When you are with your infant, feed on demand and pump after each feeding.   °· When you are away from your infant for several hours, pump for about 15 minutes every 2-3 hours. Pump both breasts at the same time if you can.   °· If your infant has a formula feeding, make sure to pump around the same time.     °· If you drink any alcohol, wait 2 hours before pumping.   °HOW DO I PREPARE TO PUMP? °Your let-down reflex is the natural reaction to stimulation that makes your breast milk flow. It is easier to stimulate this reflex when you are relaxed. Find relaxation techniques that work for you. If you have difficulty with your let-down reflex, try these methods:  °· Smell one of your infant's blankets or an item of clothing.   °· Look at a picture or video of your infant.   °· Sit in a quiet, private space.   °· Massage the breast you plan to pump.   °· Place soothing warmth on the breast.   °· Play relaxing music.   °WHAT ARE SOME GENERAL BREAST PUMPING TIPS? °· Wash your hands before you pump. You  do not need to wash your nipples or breasts. °· There are three ways to pump. °¨ You can use your hand to massage and compress your breast. °¨ You can use a handheld manual pump. °¨ You can use an electric pump.   °· Make sure the suction cup (flange) on the breast pump is the right size. Place the flange directly over the nipple. If it is the wrong size or placed the wrong way, it may be painful and cause nipple damage.   °· If pumping is uncomfortable, apply a small amount of purified or modified lanolin to your nipple and areola. °· If you are using an electric pump, adjust the speed and suction power to be more comfortable. °· If pumping is painful or if you are not getting very much milk, you may need a different type of pump. A lactation consultant can help you determine what type of pump to use.   °· Keep a full water bottle near you at all times. Drinking lots of fluid helps you make more milk.  °· You can store your milk to use later. Pumped breast milk can be stored in a sealable, sterile container or plastic bag. Label all stored breast milk with the date you pumped it. °¨ Milk can stay out at room temperature for up to 8 hours. °¨   You can store your milk in the refrigerator for up to 8 days. °¨ You can store your milk in the freezer for 3 months. Thaw frozen milk using warm water. Do not put it in the microwave. °· Do not smoke. Smoking can lower your milk supply and harm your infant. If you need help quitting, ask your health care provider to recommend a program.   °WHEN SHOULD I CALL MY HEALTH CARE PROVIDER OR A LACTATION CONSULTANT? °· You are having trouble pumping. °· You are concerned that you are not making enough milk. °· You have nipple pain, soreness, or redness. °· You want to use birth control. Birth control pills may lower your milk supply. Talk to your health care provider about your options. °Document Released: 10/08/2009 Document Revised: 04/25/2013 Document Reviewed:  02/10/2013 °[employer]® Patient Information ©2015 [employer]. This information is not intended to replace advice given to you by your health care provider. Make sure you discuss any questions you have with your health care provider. ° °Nutrition for the New Mother  °A new mother needs good health and nutrition so she can have energy to take care of a new baby. Whether a mother breastfeeds or formula feeds the baby, it is important to have a well-balanced diet. Foods from all the food groups should be chosen to meet the new mother's energy needs and to give her the nutrients needed for repair and healing.  °A HEALTHY EATING PLAN °The My Pyramid plan for Moms outlines what you should eat to help you and your baby stay healthy. The energy and amount of food you need depends on whether or not you are breastfeeding. If you are breastfeeding you will need more nutrients. If you choose not to breastfeed, your nutrition goal should be to return to a healthy weight. Limiting calories may be needed if you are not breastfeeding.  °HOME CARE INSTRUCTIONS  °· For a personal plan based on your unique needs, see your Registered Dietitian or visit www.mypyramid.gov. °· Eat a variety of foods. The plan below will help guide you. The following chart has a suggested daily meal plan from the My Pyramid for Moms. °· Eat a variety of fruits and vegetables. °· Eat more dark green and orange vegetables and cooked dried beans. °· Make half your grains whole grains. Choose whole instead of refined grains. °· Choose low-fat or lean meats and poultry. °· Choose low-fat or fat-free dairy products like milk, cheese, or yogurt. °Fruits °· Breastfeeding: 2 cups °· Non-Breastfeeding: 2 cups °· What Counts as a serving? °¨ 1 cup of fruit or juice. °¨ ½ cup dried fruit. °Vegetables °· Breastfeeding: 3 cups °· Non-Breastfeeding: 2 ½ cups °· What Counts as a serving? °¨ 1 cup raw or cooked vegetables. °¨ Juice or 2 cups raw leafy  vegetables. °Grains °· Breastfeeding: 8 oz °· Non-Breastfeeding: 6 oz °· What Counts as a serving? °¨ 1 slice bread. °¨ 1 oz ready-to-eat cereal. °¨ ½ cup cooked pasta, rice, or cereal. °Meat and Beans °· Breastfeeding: 6 ½ oz °· Non-Breastfeeding: 5 ½ oz °· What Counts as a serving? °¨ 1 oz lean meat, poultry, or fish °¨ ¼ cup cooked dry beans °¨ ½ oz nuts or 1 egg °¨ 1 tbs peanut butter °Milk °· Breastfeeding: 3 cups °· Non-Breastfeeding: 3 cups °· What Counts as a serving? °¨ 1 cup milk. °¨ 8 oz yogurt. °¨ 1 ½ oz cheese. °¨ 2 oz processed cheese. °TIPS FOR THE BREASTFEEDING MOM °· Rapid weight   loss is not suggested when you are breastfeeding. By simply breastfeeding, you will be able to lose the weight gained during your pregnancy. Your caregiver can keep track of your weight and tell you if your weight loss is appropriate.  Be sure to drink fluids. You may notice that you are thirstier than usual. A suggestion is to drink a glass of water or other beverage whenever you breastfeed.  Avoid alcohol as it can be passed into your breast milk.  Limit caffeine drinks to no more than 2 to 3 cups per day.  You may need to keep taking your prenatal vitamin while you are breastfeeding. Talk with your caregiver about taking a vitamin or supplement. RETURING TO A HEALTHY WEIGHT  The My Pyramid Plan for Moms will help you return to a healthy weight. It will also provide the nutrients you need.  You may need to limit "empty" calories. These include:  High fat foods like fried foods, fatty meats, fast food, butter, and mayonnaise.  High sugar foods like sodas, jelly, candy, and sweets.  Be physically active. Include 30 minutes of exercise or more each day. Choose an activity you like such as walking, swimming, biking, or aerobics. Check with your caregiver before you start to exercise. Document Released: 07/28/2007 Document Revised: 07/13/2011 Document Reviewed: 07/28/2007 [facility] Patient Information  2015 [place], [place]. This information is not intended to replace advice given to you by your health care provider. Make sure you discuss any questions you have with your health care provider. Postpartum Depression and Baby Blues The postpartum period begins right after the birth of a baby. During this time, there is often a great amount of joy and excitement. It is also a time of many changes in the life of the parents. Regardless of how many times a mother gives birth, each child brings new challenges and dynamics to the family. It is not unusual to have feelings of excitement along with confusing shifts in moods, emotions, and thoughts. All mothers are at risk of developing postpartum depression or the "baby blues." These mood changes can occur right after giving birth, or they may occur many months after giving birth. The baby blues or postpartum depression can be mild or severe. Additionally, postpartum depression can go away rather quickly, or it can be a long-term condition.  CAUSES Raised hormone levels and the rapid drop in those levels are thought to be a main cause of postpartum depression and the baby blues. A number of hormones change during and after pregnancy. Estrogen and progesterone usually decrease right after the delivery of your baby. The levels of thyroid hormone and various cortisol steroids also rapidly drop. Other factors that play a role in these mood changes include major life events and genetics.  RISK FACTORS If you have any of the following risks for the baby blues or postpartum depression, know what symptoms to watch out for during the postpartum period. Risk factors that may increase the likelihood of getting the baby blues or postpartum depression include:  Having a personal or family history of depression.   Having depression while being pregnant.   Having premenstrual mood issues or mood issues related to oral contraceptives.  Having a lot of life stress.   Having  marital conflict.   Lacking a social support network.   Having a baby with special needs.   Having health problems, such as diabetes.  SIGNS AND SYMPTOMS Symptoms of baby blues include:  Brief changes in mood, such as going  from extreme happiness to sadness.  Decreased concentration.   Difficulty sleeping.   Crying spells, tearfulness.   Irritability.   Anxiety.  Symptoms of postpartum depression typically begin within the first month after giving birth. These symptoms include:  Difficulty sleeping or excessive sleepiness.   Marked weight loss.   Agitation.   Feelings of worthlessness.   Lack of interest in activity or food.  Postpartum psychosis is a very serious condition and can be dangerous. Fortunately, it is rare. Displaying any of the following symptoms is cause for immediate medical attention. Symptoms of postpartum psychosis include:   Hallucinations and delusions.   Bizarre or disorganized behavior.   Confusion or disorientation.  DIAGNOSIS  A diagnosis is made by an evaluation of your symptoms. There are no medical or lab tests that lead to a diagnosis, but there are various questionnaires that a health care provider may use to identify those with the baby blues, postpartum depression, or psychosis. Often, a screening tool called the [place] Postnatal Depression Scale is used to diagnose depression in the postpartum period.  TREATMENT The baby blues usually goes away on its own in 1-2 weeks. Social support is often all that is needed. You will be encouraged to get adequate sleep and rest. Occasionally, you may be given medicines to help you sleep.  Postpartum depression requires treatment because it can last several months or longer if it is not treated. Treatment may include individual or group therapy, medicine, or both to address any social, physiological, and psychological factors that may play a role in the depression. Regular exercise, a  healthy diet, rest, and social support may also be strongly recommended.  Postpartum psychosis is more serious and needs treatment right away. Hospitalization is often needed. HOME CARE INSTRUCTIONS  Get as much rest as you can. Nap when the baby sleeps.   Exercise regularly. Some women find yoga and walking to be beneficial.   Eat a balanced and nourishing diet.   Do little things that you enjoy. Have a cup of tea, take a bubble bath, read your favorite magazine, or listen to your favorite music.  Avoid alcohol.   Ask for help with household chores, cooking, grocery shopping, or running errands as needed. Do not try to do everything.   Talk to people close to you about how you are feeling. Get support from your partner, family members, friends, or other new moms.  Try to stay positive in how you think. Think about the things you are grateful for.   Do not spend a lot of time alone.   Only take over-the-counter or prescription medicine as directed by your health care provider.  Keep all your postpartum appointments.   Let your health care provider know if you have any concerns.  SEEK MEDICAL CARE IF: You are having a reaction to or problems with your medicine. SEEK IMMEDIATE MEDICAL CARE IF:  You have suicidal feelings.   You think you may harm the baby or someone else. MAKE SURE YOU:  Understand these instructions.  Will watch your condition.  Will get help right away if you are not doing well or get worse. Document Released: 01/23/2004 Document Revised: 04/25/2013 Document Reviewed: 01/30/2013 [facility] Patient Information 2015 [place], [place]. This information is not intended to replace advice given to you by your health care provider. Make sure you discuss any questions you have with your health care provider. Breastfeeding and Mastitis Mastitis is inflammation of the breast tissue. It can occur in women who  are breastfeeding. This can make breastfeeding  painful. Mastitis will sometimes go away on its own. Your health care provider will help determine if treatment is needed. CAUSES Mastitis is often associated with a blocked milk (lactiferous) duct. This can happen when too much milk builds up in the breast. Causes of excess milk in the breast can include:  Poor latch-on. If your baby is not latched onto the breast properly, she or he may not empty your breast completely while breastfeeding.  Allowing too much time to pass between feedings.  Wearing a bra or other clothing that is too tight. This puts extra pressure on the lactiferous ducts so milk does not flow through them as it should. Mastitis can also be caused by a bacterial infection. Bacteria may enter the breast tissue through cuts or openings in the skin. In women who are breastfeeding, this may occur because of cracked or irritated skin. Cracks in the skin are often caused when your baby does not latch on properly to the breast. SIGNS AND SYMPTOMS  Swelling, redness, tenderness, and pain in an area of the breast.  Swelling of the glands under the arm on the same side.  Fever may or may not accompany mastitis. If an infection is allowed to progress, a collection of pus (abscess) may develop. DIAGNOSIS  Your health care provider can usually diagnose mastitis based on your symptoms and a physical exam. Tests may be done to help confirm the diagnosis. These may include:  Removal of pus from the breast by applying pressure to the area. This pus can be examined in the lab to determine which bacteria are present. If an abscess has developed, the fluid in the abscess can be removed with a needle. This can also be used to confirm the diagnosis and determine the bacteria present. In most cases, pus will not be present.  Blood tests to determine if your body is fighting a bacterial infection.  Mammogram or ultrasound tests to rule out other problems or diseases. TREATMENT  Mastitis that  occurs with breastfeeding will sometimes go away on its own. Your health care provider may choose to wait 24 hours after first seeing you to decide whether a prescription medicine is needed. If your symptoms are worse after 24 hours, your health care provider will likely prescribe an antibiotic medicine to treat the mastitis. He or she will determine which bacteria are most likely causing the infection and will then select an appropriate antibiotic medicine. This is sometimes changed based on the results of tests performed to identify the bacteria, or if there is no response to the antibiotic medicine selected. Antibiotic medicines are usually given by mouth. You may also be given medicine for pain. HOME CARE INSTRUCTIONS  Only take over-the-counter or prescription medicines for pain, fever, or discomfort as directed by your health care provider.  If your health care provider prescribed an antibiotic medicine, take the medicine as directed. Make sure you finish it even if you start to feel better.  Do not wear a tight or underwire bra. Wear a soft, supportive bra.  Increase your fluid intake, especially if you have a fever.  Continue to empty the breast. Your health care provider can tell you whether this milk is safe for your infant or needs to be thrown out. You may be told to stop nursing until your health care provider thinks it is safe for your baby. Use a breast pump if you are advised to stop nursing.  Keep your nipples  clean and dry.  Empty the first breast completely before going to the other breast. If your baby is not emptying your breasts completely for some reason, use a breast pump to empty your breasts.  If you go back to work, pump your breasts while at work to stay in time with your nursing schedule.  Avoid allowing your breasts to become overly filled with milk (engorged). SEEK MEDICAL CARE IF:  You have pus-like discharge from the breast.  Your symptoms do not improve with  the treatment prescribed by your health care provider within 2 days. SEEK IMMEDIATE MEDICAL CARE IF:  Your pain and swelling are getting worse.  You have pain that is not controlled with medicine.  You have a red line extending from the breast toward your armpit.  You have a fever or persistent symptoms for more than 2-3 days.  You have a fever and your symptoms suddenly get worse. MAKE SURE YOU:   Understand these instructions.  Will watch your condition.  Will get help right away if you are not doing well or get worse. Document Released: 08/15/2004 Document Revised: 04/25/2013 Document Reviewed: 11/24/2012 [facility] Patient Information 2015 [place], [place]. This information is not intended to replace advice given to you by your health care provider. Make sure you discuss any questions you have with your health care provider. Breastfeeding Deciding to breastfeed is one of the best choices you can make for you and your baby. A change in hormones during pregnancy causes your breast tissue to grow and increases the number and size of your milk ducts. These hormones also allow proteins, sugars, and fats from your blood supply to make breast milk in your milk-producing glands. Hormones prevent breast milk from being released before your baby is born as well as prompt milk flow after birth. Once breastfeeding has begun, thoughts of your baby, as well as his or her sucking or crying, can stimulate the release of milk from your milk-producing glands.  BENEFITS OF BREASTFEEDING For Your Baby  Your first milk (colostrum) helps your baby's digestive system function better.   There are antibodies in your milk that help your baby fight off infections.   Your baby has a lower incidence of asthma, allergies, and sudden infant death syndrome.   The nutrients in breast milk are better for your baby than infant formulas and are designed uniquely for your baby's needs.   Breast milk improves your  baby's brain development.   Your baby is less likely to develop other conditions, such as childhood obesity, asthma, or type 2 diabetes mellitus.  For You   Breastfeeding helps to create a very special bond between you and your baby.   Breastfeeding is convenient. Breast milk is always available at the correct temperature and costs nothing.   Breastfeeding helps to burn calories and helps you lose the weight gained during pregnancy.   Breastfeeding makes your uterus contract to its prepregnancy size faster and slows bleeding (lochia) after you give birth.   Breastfeeding helps to lower your risk of developing type 2 diabetes mellitus, osteoporosis, and breast or ovarian cancer later in life. SIGNS THAT YOUR BABY IS HUNGRY Early Signs of Hunger  Increased alertness or activity.  Stretching.  Movement of the head from side to side.  Movement of the head and opening of the mouth when the corner of the mouth or cheek is stroked (rooting).  Increased sucking sounds, smacking lips, cooing, sighing, or squeaking.  Hand-to-mouth movements.  Increased sucking of  fingers or hands. Late Signs of Hunger  Fussing.  Intermittent crying. Extreme Signs of Hunger Signs of extreme hunger will require calming and consoling before your baby will be able to breastfeed successfully. Do not wait for the following signs of extreme hunger to occur before you initiate breastfeeding:   Restlessness.  A loud, strong cry.   Screaming. BREASTFEEDING BASICS Breastfeeding Initiation  Find a comfortable place to sit or lie down, with your neck and back well supported.  Place a pillow or rolled up blanket under your baby to bring him or her to the level of your breast (if you are seated). Nursing pillows are specially designed to help support your arms and your baby while you breastfeed.  Make sure that your baby's abdomen is facing your abdomen.   Gently massage your breast. With your  fingertips, massage from your chest wall toward your nipple in a circular motion. This encourages milk flow. You may need to continue this action during the feeding if your milk flows slowly.  Support your breast with 4 fingers underneath and your thumb above your nipple. Make sure your fingers are well away from your nipple and your baby's mouth.   Stroke your baby's lips gently with your finger or nipple.   When your baby's mouth is open wide enough, quickly bring your baby to your breast, placing your entire nipple and as much of the colored area around your nipple (areola) as possible into your baby's mouth.   More areola should be visible above your baby's upper lip than below the lower lip.   Your baby's tongue should be between his or her lower gum and your breast.   Ensure that your baby's mouth is correctly positioned around your nipple (latched). Your baby's lips should create a seal on your breast and be turned out (everted).  It is common for your baby to suck about 2-3 minutes in order to start the flow of breast milk. Latching Teaching your baby how to latch on to your breast properly is very important. An improper latch can cause nipple pain and decreased milk supply for you and poor weight gain in your baby. Also, if your baby is not latched onto your nipple properly, he or she may swallow some air during feeding. This can make your baby fussy. Burping your baby when you switch breasts during the feeding can help to get rid of the air. However, teaching your baby to latch on properly is still the best way to prevent fussiness from swallowing air while breastfeeding. Signs that your baby has successfully latched on to your nipple:    Silent tugging or silent sucking, without causing you pain.   Swallowing heard between every 3-4 sucks.    Muscle movement above and in front of his or her ears while sucking.  Signs that your baby has not successfully latched on to  nipple:   Sucking sounds or smacking sounds from your baby while breastfeeding.  Nipple pain. If you think your baby has not latched on correctly, slip your finger into the corner of your baby's mouth to break the suction and place it between your baby's gums. Attempt breastfeeding initiation again. Signs of Successful Breastfeeding Signs from your baby:   A gradual decrease in the number of sucks or complete cessation of sucking.   Falling asleep.   Relaxation of his or her body.   Retention of a small amount of milk in his or her mouth.   Letting go  of your breast by himself or herself. Signs from you:  Breasts that have increased in firmness, weight, and size 1-3 hours after feeding.   Breasts that are softer immediately after breastfeeding.  Increased milk volume, as well as a change in milk consistency and color by the fifth day of breastfeeding.   Nipples that are not sore, cracked, or bleeding. Signs That Your [employer] is Getting Enough Milk  Wetting at least 3 diapers in a 24-hour period. The urine should be clear and pale yellow by age 710 days.  At least 3 stools in a 24-hour period by age 710 days. The stool should be soft and yellow.  At least 3 stools in a 24-hour period by age 71 days. The stool should be seedy and yellow.  No loss of weight greater than 10% of birth weight during the first 48 days of age.  Average weight gain of 4-7 ounces (113-198 g) per week after age 39 days.  Consistent daily weight gain by age 2 days, without weight loss after the age of 2 weeks. After a feeding, your baby may spit up a small amount. This is common. BREASTFEEDING FREQUENCY AND DURATION Frequent feeding will help you make more milk and can prevent sore nipples and breast engorgement. Breastfeed when you feel the need to reduce the fullness of your breasts or when your baby shows signs of hunger. This is called "breastfeeding on demand." Avoid introducing a pacifier to your  baby while you are working to establish breastfeeding (the first 4-6 weeks after your baby is born). After this time you may choose to use a pacifier. Research has shown that pacifier use during the first year of a baby's life decreases the risk of sudden infant death syndrome (SIDS). Allow your baby to feed on each breast as long as he or she wants. Breastfeed until your baby is finished feeding. When your baby unlatches or falls asleep while feeding from the first breast, offer the second breast. Because newborns are often sleepy in the first few weeks of life, you may need to awaken your baby to get him or her to feed. Breastfeeding times will vary from baby to baby. However, the following rules can serve as a guide to help you ensure that your baby is properly fed:  Newborns (babies 40 weeks of age or younger) may breastfeed every 1-3 hours.  Newborns should not go longer than 3 hours during the day or 5 hours during the night without breastfeeding.  You should breastfeed your baby a minimum of 8 times in a 24-hour period until you begin to introduce solid foods to your baby at around 16 months of age. BREAST MILK PUMPING Pumping and storing breast milk allows you to ensure that your baby is exclusively fed your breast milk, even at times when you are unable to breastfeed. This is especially important if you are going back to work while you are still breastfeeding or when you are not able to be present during feedings. Your lactation consultant can give you guidelines on how long it is safe to store breast milk.  A breast pump is a machine that allows you to pump milk from your breast into a sterile bottle. The pumped breast milk can then be stored in a refrigerator or freezer. Some breast pumps are operated by hand, while others use electricity. Ask your lactation consultant which type will work best for you. Breast pumps can be purchased, but some hospitals and breastfeeding support groups  lease  breast pumps on a monthly basis. A lactation consultant can teach you how to hand express breast milk, if you prefer not to use a pump.  CARING FOR YOUR BREASTS WHILE YOU BREASTFEED Nipples can become dry, cracked, and sore while breastfeeding. The following recommendations can help keep your breasts moisturized and healthy:  Avoid using soap on your nipples.   Wear a supportive bra. Although not required, special nursing bras and tank tops are designed to allow access to your breasts for breastfeeding without taking off your entire bra or top. Avoid wearing underwire-style bras or extremely tight bras.  Air dry your nipples for 3-72minutes after each feeding.   Use only cotton bra pads to absorb leaked breast milk. Leaking of breast milk between feedings is normal.   Use lanolin on your nipples after breastfeeding. Lanolin helps to maintain your skin's normal moisture barrier. If you use pure lanolin, you do not need to wash it off before feeding your baby again. Pure lanolin is not toxic to your baby. You may also hand express a few drops of breast milk and gently massage that milk into your nipples and allow the milk to air dry. In the first few weeks after giving birth, some women experience extremely full breasts (engorgement). Engorgement can make your breasts feel heavy, warm, and tender to the touch. Engorgement peaks within 3-5 days after you give birth. The following recommendations can help ease engorgement:  Completely empty your breasts while breastfeeding or pumping. You may want to start by applying warm, moist heat (in the shower or with warm water-soaked hand towels) just before feeding or pumping. This increases circulation and helps the milk flow. If your baby does not completely empty your breasts while breastfeeding, pump any extra milk after he or she is finished.  Wear a snug bra (nursing or regular) or tank top for 1-2 days to signal your body to slightly decrease milk  production.  Apply ice packs to your breasts, unless this is too uncomfortable for you.  Make sure that your baby is latched on and positioned properly while breastfeeding. If engorgement persists after 48 hours of following these recommendations, contact your health care provider or a Science writer. OVERALL HEALTH CARE RECOMMENDATIONS WHILE BREASTFEEDING  Eat healthy foods. Alternate between meals and snacks, eating 3 of each per day. Because what you eat affects your breast milk, some of the foods may make your baby more irritable than usual. Avoid eating these foods if you are sure that they are negatively affecting your baby.  Drink milk, fruit juice, and water to satisfy your thirst (about 10 glasses a day).   Rest often, relax, and continue to take your prenatal vitamins to prevent fatigue, stress, and anemia.  Continue breast self-awareness checks.  Avoid chewing and smoking tobacco.  Avoid alcohol and drug use. Some medicines that may be harmful to your baby can pass through breast milk. It is important to ask your health care provider before taking any medicine, including all over-the-counter and prescription medicine as well as vitamin and herbal supplements. It is possible to become pregnant while breastfeeding. If birth control is desired, ask your health care provider about options that will be safe for your baby. SEEK MEDICAL CARE IF:   You feel like you want to stop breastfeeding or have become frustrated with breastfeeding.  You have painful breasts or nipples.  Your nipples are cracked or bleeding.  Your breasts are red, tender, or warm.  You have  a swollen area on either breast.  You have a fever or chills.  You have nausea or vomiting.  You have drainage other than breast milk from your nipples.  Your breasts do not become full before feedings by the fifth day after you give birth.  You feel sad and depressed.  Your baby is too sleepy to eat  well.  Your baby is having trouble sleeping.   Your baby is wetting less than 3 diapers in a 24-hour period.  Your baby has less than 3 stools in a 24-hour period.  Your baby's skin or the white part of his or her eyes becomes yellow.   Your baby is not gaining weight by 13 days of age. SEEK IMMEDIATE MEDICAL CARE IF:   Your baby is overly tired (lethargic) and does not want to wake up and feed.  Your baby develops an unexplained fever. Document Released: 04/20/2005 Document Revised: 04/25/2013 Document Reviewed: 10/12/2012 [facility] Patient Information 2015 [place], [place]. This information is not intended to replace advice given to you by your health care provider. Make sure you discuss any questions you have with your health care provider.

## 2014-05-13 NOTE — Discharge Summary (Signed)
Obstetric Discharge Summary  Reason for Admission: Pt is a G2P2002 at [redacted]w[redacted]d admitted for labor augmentation in early labor due to olgiohydramnios. Pt notes not feeling any contractions. Good fetal movement, vaginal bleeding, light, that started this am which precipitated u/s which showed oligo with an AFI of 4.7.  Patient has received care at [facility] since 8.5 wks, with Dr. Dellis Filbert as primary provider.  Medications on Admission: Prescriptions prior to admission  Medication Sig Dispense Refill Last Dose  . docusate sodium (COLACE) 100 MG capsule Take 100 mg by mouth 2 (two) times daily.   05/09/2014 at Unknown time  . doxylamine, Sleep, (UNISOM) 25 MG tablet Take 25 mg by mouth at bedtime as needed.   05/09/2014 at Unknown time  . lidocaine (XYLOCAINE) 5 % ointment Apply 1 application topically as needed.   Past Week at Unknown time  . magnesium gluconate (MAGONATE) 500 MG tablet Take 500 mg by mouth daily.   05/09/2014 at Unknown time    Prenatal Labs: ABO, Rh: O POS (01/07 1150)  Antibody: NEG (01/07 1150) Rubella: Immune (06/09 0000)  RPR: NON REAC (01/07 1150)  HBsAg: Negative (06/09 0000)  HIV: Non-reactive (06/09 0000)  GTT : Normal GBS: Positive (12/30 0000)   Prenatal Procedures: ultrasound Intrapartum Course: Admitted for suspected PROM / labor augmentation in early labor due to olgiohydramnios. Pt notes not feeling any contractions. Good fetal movement, vaginal bleeding, light, that started this am which precipitated u/s which showed oligo with an AFI of 4.7 / prolonged wait for admission to labor delivery room / normal progression to completely dilated / SVD of viable female with 2nd degree repair by Dr. Ronita Hipps / no immediate postpartum complications Intrapartum Procedures: spontaneous vaginal delivery Postpartum Procedures: none Complications-Operative and Postpartum: 2nd degree perineal laceration  Labs: HEMOGLOBIN  Date Value Ref Range Status  05/12/2014 9.1* 12.0 -  15.0 g/dL Final   HCT  Date Value Ref Range Status  05/12/2014 29.0* 36.0 - 46.0 % Final   Lab Results  Component Value Date   PLT 223 05/12/2014    Newborn Data: Live born female  Birth Weight: 7 lb 7.9 oz (3399 g) APGAR: 9, 9  Home with mother.   Discharge Information: Date: 05/13/2014 Discharge Diagnoses:  Pt is a G2P2002 at [redacted]w[redacted]d S/P Term Pregnancy-delivered on 05/11/2014  Condition: stable Activity: pelvic rest Diet: routine Medications:    Medication List    TAKE these medications        benzocaine-Menthol 20-0.5 % Aero  Commonly known as:  DERMOPLAST  Apply 1 application topically as needed for irritation (perineal discomfort).     docusate sodium 100 MG capsule  Commonly known as:  COLACE  Take 100 mg by mouth 2 (two) times daily.     doxylamine (Sleep) 25 MG tablet  Commonly known as:  UNISOM  Take 25 mg by mouth at bedtime as needed.     ibuprofen 600 MG tablet  Commonly known as:  ADVIL,MOTRIN  Take 1 tablet (600 mg total) by mouth every 6 (six) hours.     iron polysaccharides 150 MG capsule  Commonly known as:  NIFEREX  Take 1 capsule (150 mg total) by mouth daily.     lidocaine 5 % ointment  Commonly known as:  XYLOCAINE  Apply 1 application topically as needed.     magnesium gluconate 500 MG tablet  Commonly known as:  MAGONATE  Take 500 mg by mouth daily.     magnesium oxide 400 (241.3 MG)  MG tablet  Commonly known as:  MAG-OX  Take 1 tablet (400 mg total) by mouth daily.     oxyCODONE-acetaminophen 5-325 MG per tablet  Commonly known as:  PERCOCET/ROXICET  Take 1 tablet by mouth every 4 (four) hours as needed (for pain scale less than 7).       Instructions: [facility] OB[facility]GYN instruction booklet has been given and reviewed Discharge to: home     Follow-up Information    Follow up with LAVOIE,MARIE-LYNE, MD. Schedule an appointment as soon as possible for a visit in 6 weeks.   Specialty:  Obstetrics and Gynecology   Why:   postpartum visit   Contact information:   [address] [place] [address] [phone]       Laury Deep, Jerilynn Mages, MSN, CNM 05/13/2014, 10:10 AM

## 2014-05-13 NOTE — Lactation Note (Signed)
This note was copied from the chart of Michele Gilbert. Lactation Consultation Note  Patient Name: Michele Gilbert [id number] Date: 05/13/2014 Reason for consult: Follow-up assessment;Infant weight loss;Breast/nipple pain   Follow-up at 57 hours old; mom c/o lots of pain with BF; with an 8% weight loss.  Infant has breastfed x3 (10-15 min) + attempt x3 (5-7 min) + formula bottles Alimentum (d/t extreme nipple soreness) x7 (3-10 ml) in past 24 hrs; voids-3 in 24 hours/ 6 life; stools-3 in 24 hours/ 4 life.  Infant fed in football hold for 10 minutes with wide mouth and flanged lips; very few swallows heard, but mom continued to c/o lots of pain with feedings.  Reviewed hand expression and mom continued to c/o pain with hand expression.  Small drop of colostrum noted on nipple tip.  Oral assessment done with gloved finger; upper lip frenulum extends to tip of gum line; sublingual frenulum noted to be deep posterior, short, and tight (blanches with lifting of tongue); infant can lateralize tip of tongue minimally, but infant cannot maintain consistent latch, lacks peristalsis movement, "slapping" motion and chewing noted on gloved finger with tongue.  Discussed limited tongue mobility r/t infant weight loss, decreased transfer of milk, and nipple pain with breastfeeding.  Discussed with parents need to get further assessment of tongue by expert professional who can fully release all ties and to discuss with pediatrician.  Mom was is so much pain with latching LC suggested trying a nipple shield to see if it minimizes pain.  Latched infant with a #20 Nipple Shield but shield was too tight and uncomfortable for mom.  #24 nipple shield given for home use but mom did not want to re-latch infant d/t intense pain with latching.  Comfort gels given and explained how to use.  Feeding Plan - Collaborated with parents to develop feeding plan for home use since mom was in so much pain with breastfeeding and limited  tongue mobility noted with assessment 1.  Attempt breastfeeding as often as possible using #24 nipple shield if needed.  Look for colostrum or milk in shield at end of feeding and listen for swallows during feeding. 2.  Supplement with formula as discussed with pediatrician or EBM as available.  Parents liked using the finger feeding method with curved-tip syringe but since infant may be supplementing frequently and for longer periods of time d/t tongue mobility issues, LC gave slow flow nipples and taught parents how to feed with paced-bottle feeding method as an extra feeding option.  Supplementation guidelines given for supplementation amounts with directions to increase amounts gradually as age increases based on infant's feeding cues.  3.  Pump 8 times per day with DEBP.  Mom had pump in room and asked LC to check pump.  LC answered questions.  New pump kit given as requested by mom for her DEBP.  Extra pump bottles given for home use. 4.  Follow up with expert on releasing any tongue ties and follow-up with pediatrician regarding tongue ties 5.  Follow-up with LC in Outpatient setting.  Appointment made for this coming Friday, Jan 15 @ 1p   Maternal Data Has patient been taught Hand Expression?: Yes  Feeding Feeding Type: Breast Fed Length of feed: 10 min  LATCH Score/Interventions Latch: Repeated attempts needed to sustain latch, nipple held in mouth throughout feeding, stimulation needed to elicit sucking reflex. Intervention(s): Teach feeding cues;Waking techniques;Skin to skin  Audible Swallowing: A few with stimulation Intervention(s): Hand expression  Type of  Nipple: Everted at rest and after stimulation  Comfort (Breast/Nipple): Filling, red/small blisters or bruises, mild/mod discomfort  Problem noted: Mild/Moderate discomfort Interventions (Mild/moderate discomfort): Comfort gels;Hand expression  Hold (Positioning): Assistance needed to correctly position infant at breast  and maintain latch. Intervention(s): Breastfeeding basics reviewed;Support Pillows;Position options;Skin to skin  LATCH Score: 6  Lactation Tools Discussed/Used Tools: Nipple Shields Nipple shield size: 20;Other (comment) (needs to increase to #24 - #20 was too tight but mom was too sore to continue with BF) WIC Program: No Pump Review: Setup, frequency, and cleaning;Milk Storage Initiated by:: Gaynelle Adu RN, IBCLC Date initiated:: 05/13/14   Consult Status Consult Status: Follow-up Date: 05/18/14 Follow-up type: Out-patient    Merlene Laughter 05/13/2014, 12:02 PM

## 2014-05-18 ENCOUNTER — Ambulatory Visit (HOSPITAL_COMMUNITY)
Admit: 2014-05-18 | Discharge: 2014-05-18 | Disposition: A | Discharge status: Home / Self Care | Payer: 59 | Attending: Obstetrics & Gynecology | Admitting: Obstetrics & Gynecology

## 2014-05-18 NOTE — Lactation Note (Signed)
Lactation Consult for Michele Gilbert, born [birth year]  Consult:  Initial Lactation Consultant:  Larkin Ina  ________________________________________________________________________ BW: [phone] (7# 7.9oz) Today's weight: 6812X (7# 1oz) (5.7% below BW)  ______________________________  Mother's Name: Michele Gilbert Breastfeeding Experience: 2nd-time Mom Maternal Medical Conditions:  None Maternal Medications:  None  ________________________________________________________________________  Breastfeeding History (Post Discharge)  Frequency of breastfeeding: none Duration of feeding: n/a  Pumping  Type of pump:  Medela pump in style Frequency:q3h for 10 min Volume:  55 ml  Infant Intake and Output Assessment  Voids:  6 in 24 hrs.   Stools: 2 in 24 hrs.  Color:  Yellow  Parents educated that a minimum of 4 stools/24 hours is desirable.   ________________________________________________________________________  Maternal Breast Assessment  Breast:  Full Nipple:  Erect _______________________________________________________________________ Feeding Assessment/Evaluation Total supplement given:  92 ml  Mom has decided to exclusively pump & BO, secondary to sore nipples, etc.  Mom's L nipple healed a few days ago, after having not fed from the breast & having been Rx APNO.  Parents went to Dr. Constance Holster, who did not note ankyloglossia.  Mom was finding pumping uncomfortable w/size 27 flanges.  Mom's flanges were changed to a size 30, which she found greatly increased her comfort level.   Baby's weight is 5.7% below BW at 1 week of age.  Mom has only been giving 332mL (11.6oz)/day.  I explained to parents that baby needs about 17oz+/day to return to BW. Parents plan to increase volume of feeds (Camilla does receive about 2 oz of formula/day).   Mom has not been taking her iron, due to concerns about constipation.  I recommended that Mom consider taking Alfalfa leaf for  its beneficial effects for both anemia and milk supply.  Mom will also begin taking lecithin 1200mg  tid-qid to aid in prevention of plugged ducts.  Per maternal report, Mom had many plugged ducts w/her 1st child (and resulting mastitis) and is now feeling as if she is beginning to get them again.  Mom has a pea-sized nodule that is sore in the right outer quadrant of her right breast that has not been released over the last few days with pumping.  No redness noted, no fever.  I recommended Mom use moist heat while pumping.    Mother pleased w/education received (and newly-sized flanges). No need to return unless per maternal request.

## 2015-01-18 ENCOUNTER — Ambulatory Visit (INDEPENDENT_AMBULATORY_CARE_PROVIDER_SITE_OTHER): Payer: 59 | Admitting: Family Medicine

## 2015-01-18 ENCOUNTER — Telehealth: Payer: Self-pay | Admitting: Family Medicine

## 2015-01-18 ENCOUNTER — Encounter: Payer: Self-pay | Admitting: Family Medicine

## 2015-01-18 VITALS — BP 96/58 | HR 86 | Temp 98.1°F | Ht 63.0 in | Wt 132.8 lb

## 2015-01-18 DIAGNOSIS — Z8669 Personal history of other diseases of the nervous system and sense organs: Secondary | ICD-10-CM

## 2015-01-18 DIAGNOSIS — J0100 Acute maxillary sinusitis, unspecified: Secondary | ICD-10-CM | POA: Diagnosis not present

## 2015-01-18 DIAGNOSIS — K5900 Constipation, unspecified: Secondary | ICD-10-CM

## 2015-01-18 DIAGNOSIS — K5909 Other constipation: Secondary | ICD-10-CM | POA: Insufficient documentation

## 2015-01-18 MED ORDER — DOXYCYCLINE HYCLATE 100 MG PO TABS: 100.0000 mg | ORAL_TABLET | Freq: Two times a day (BID) | ORAL | Status: DC

## 2015-01-18 NOTE — Telephone Encounter (Signed)
Patient informed. 

## 2015-01-18 NOTE — Telephone Encounter (Signed)
Pt following up on RX (abx) Pt would like a cb when done.

## 2015-01-18 NOTE — Patient Instructions (Signed)
BEFORE YOU LEAVE: -schedule follow up in 3-4 weeks  Start the antibiotic and take twice daily, avoid sun exposure while on this medication.  Stop the colace Start fiber supplement every morning (metameucil) Use mirilax 1-2 times daily as needed for constipation Sitz baths twice daily and over the counter hemorrhoid cream as needed for hemorrhoids

## 2015-01-18 NOTE — Telephone Encounter (Signed)
Sent to [facility] [address]. Can you ck to see if pharmacy has and notify pt. Thanks.

## 2015-01-18 NOTE — Progress Notes (Signed)
Pre visit review using our clinic review tool, if applicable. No additional management support is needed unless otherwise documented below in the visit note. 

## 2015-01-18 NOTE — Telephone Encounter (Signed)
Pt went to the pharmacy to pick her rx and it was not there. She said she was suppose to pick up an antibiotic      [facility] [address]

## 2015-01-18 NOTE — Progress Notes (Signed)
HPI:  Michele Gilbert is a 34 yo F with a PMH dig for chronic and frequent migraines here for an acute visit for several issues:  Sinus congestion: -started two weeks ago and worsening -symptoms: nasal congestion, R ear pressure, sinus pressure and now maxillary sinus pain, headaches, some nausea and vomited once yesterday, sharp headache all over head yesterday - better today, subjective fever recently -denies: ob fevers, neck pain or stiffness, SOB, tooth pain, sick contacts, tick bite -hx allergies, headaches, migraines -FDLMP 01/11/15  Chronic constipation: -for many years, hemorrhoids after pregnancy -take colace prescribed by her ob but this causes diarrhea -hemorrhoids flare if stops colace and gets constipation, current flare -denies: change in bowels, melena, hematochezia  ROS: See pertinent positives and negatives per HPI.  Past Medical History  Diagnosis Date  . Headache(784.0)     frequent  . Migraine   . UTI (urinary tract infection)   . Constipation   . Mastitis     Past Surgical History  Procedure Laterality Date  . No past surgeries      Family History  Problem Relation Age of Onset  . Hyperlipidemia Father   . Colon cancer      grandparent  . Diabetes Other     Social History   Social History  . Marital Status: Married    Spouse Name: N/A  . Number of Children: N/A  . Years of Education: N/A   Social History Main Topics  . Smoking status: Never Smoker   . Smokeless tobacco: None  . Alcohol Use: No  . Drug Use: No  . Sexual Activity: Yes    Birth Control/ Protection: None   Other Topics Concern  . None   Social History Narrative     Current outpatient prescriptions:  .  Bisacodyl (DULCOLAX PO), Take by mouth., Disp: , Rfl:  .  ibuprofen (ADVIL,MOTRIN) 600 MG tablet, Take 1 tablet (600 mg total) by mouth every 6 (six) hours., Disp: 30 tablet, Rfl: 1 .  iron polysaccharides (NIFEREX) 150 MG capsule, Take 1 capsule (150 mg total) by  mouth daily., Disp: 30 capsule, Rfl: 1 .  lidocaine (XYLOCAINE) 5 % ointment, Apply 1 application topically as needed., Disp: , Rfl:   EXAM:  Filed Vitals:   01/18/15 1019  BP: 96/58  Pulse: 86  Temp: 98.1 F (36.7 C)    Body mass index is 23.53 kg/(m^2).  GENERAL: vitals reviewed and listed above, alert, oriented, appears well hydrated and in no acute distress  HEENT: atraumatic, conjunttiva clear, no obvious abnormalities on inspection of external nose and ears, normal appearance of ear canals and TMs ex cept for clear effusion R, thick nasal congestion, mild post oropharyngeal erythema with PND, no tonsillar edema or exudate, no sinus TTP  NECK: no obvious masses on inspection  LUNGS: clear to auscultation bilaterally, no wheezes, rales or rhonchi, good air movement  CV: HRRR, no peripheral edema  ABD: soft, NTTP  MS: moves all extremities without noticeable abnormality  PSYCH: pleasant and cooperative, no obvious depression or anxiety  NEURO: CN II-XII groslly intact, finger to nose normal, speech and thought processing grossly intact, gait normal  ASSESSMENT AND PLAN:  Discussed the following assessment and plan:  Acute maxillary sinusitis, recurrence not specified Hx of migraines -suspect sinusitis with migraine yesterday, tx per plan and orders, if headaches different from past persist despite tx for this will need to get MRI, close follow up, return precautions.  Chronic constipation -fiber supplement, mirilax prn,  OTC hemorrhoid tx and sitz baths, follow up in 1 month  -Patient advised to return or notify a doctor immediately if symptoms worsen or persist or new concerns arise.  Patient Instructions  BEFORE YOU LEAVE: -schedule follow up in 3-4 weeks  Start the antibiotic and take twice daily, avoid sun exposure while on this medication.  Stop the colace Start fiber supplement every morning (metameucil) Use mirilax 1-2 times daily as needed for  constipation Sitz baths twice daily and over the counter hemorrhoid cream as needed for hemorrhoids     KIM, HANNAH R.

## 2015-01-23 ENCOUNTER — Telehealth: Payer: Self-pay | Admitting: Family Medicine

## 2015-01-23 NOTE — Telephone Encounter (Signed)
Patient Name: Michele Gilbert DOB: [birth year] Initial Comment caller states she is having side effects of medication Nurse Assessment Nurse: Ronnald Ramp, RN, Miranda Date/Time (Eastern Time): 01/23/2015 11:15:30 AM Confirm and document reason for call. If symptomatic, describe symptoms. ---Caller states she started Doxycycline on Friday for sinus infection. She has started having vaginal bleeding on Saturday and not time for her normal cycle. She just started BCP last month. She is also feeling like her cheeks are warm. Denies fever. Has the patient traveled out of the country within the last 30 days? ---Not Applicable Does the patient require triage? ---Yes Related visit to physician within the last 2 weeks? ---Yes Does the PT have any chronic conditions? (i.e. diabetes, asthma, etc.) ---No Did the patient indicate they were pregnant? ---No Guidelines Guideline Title Affirmed Question Affirmed Notes Sinus Infection on Antibiotic Follow-up Call [1] Reasonable improvement on antibiotic AND [2] no fever or pain (all triage questions negative) Final Disposition User [place], RN, Miranda Comments Told caller it is normal to have irregular cycles for the first 2-3 months after started BCP and that I did not think the vaginal bleeding was from the medication. Disagree/Comply: Comply

## 2015-02-20 ENCOUNTER — Ambulatory Visit (INDEPENDENT_AMBULATORY_CARE_PROVIDER_SITE_OTHER): Payer: 59 | Admitting: Family Medicine

## 2015-02-20 ENCOUNTER — Encounter: Payer: Self-pay | Admitting: Family Medicine

## 2015-02-20 VITALS — BP 102/68 | HR 70 | Temp 98.5°F | Ht 63.0 in | Wt 135.5 lb

## 2015-02-20 DIAGNOSIS — K5900 Constipation, unspecified: Secondary | ICD-10-CM

## 2015-02-20 DIAGNOSIS — R51 Headache: Secondary | ICD-10-CM | POA: Diagnosis not present

## 2015-02-20 DIAGNOSIS — K649 Unspecified hemorrhoids: Secondary | ICD-10-CM

## 2015-02-20 DIAGNOSIS — J309 Allergic rhinitis, unspecified: Secondary | ICD-10-CM

## 2015-02-20 DIAGNOSIS — G8929 Other chronic pain: Secondary | ICD-10-CM

## 2015-02-20 MED ORDER — FLUTICASONE PROPIONATE 50 MCG/ACT NA SUSP: 2.0000 | Freq: Every day | NASAL | Status: DC

## 2015-02-20 NOTE — Progress Notes (Signed)
Pre visit review using our clinic review tool, if applicable. No additional management support is needed unless otherwise documented below in the visit note. 

## 2015-02-20 NOTE — Patient Instructions (Signed)
BEFORE YOU LEAVE: -schedule nurse visit for flu shot and lab visit for labs (NOT fasting) in the next 1 week -follow up in 1-2 months  Start flonase 2 sprays each nostril daily for 1 month, then 1 spray each nostril daily  Allegra once daily  Continue the metameucil EVERY day and mirilax as needed - if any issues persist please call and we will place a referral to the gastroenterologist

## 2015-02-20 NOTE — Progress Notes (Signed)
HPI:  Follow up:  Constipation: -advised fiber and mirilax last visit -doing much better  -has chronic hemorrhoids which flared this week because she stopped the metameucil  HAs: -long hx migraines -tx fo sinusitis last visit -no migraines lately -PND, sneezing sinus congestion when goes outside and when this happens gets mild sinus pain that resolves - happens 1-2 times per week  ROS: See pertinent positives and negatives per HPI.  Past Medical History  Diagnosis Date  . Headache(784.0)     frequent  . Migraine   . UTI (urinary tract infection)   . Constipation   . Mastitis     Past Surgical History  Procedure Laterality Date  . No past surgeries      Family History  Problem Relation Age of Onset  . Hyperlipidemia Father   . Colon cancer      grandparent  . Diabetes Other     Social History   Social History  . Marital Status: Married    Spouse Name: N/A  . Number of Children: N/A  . Years of Education: N/A   Social History Main Topics  . Smoking status: Never Smoker   . Smokeless tobacco: None  . Alcohol Use: No  . Drug Use: No  . Sexual Activity: Yes    Birth Control/ Protection: None   Other Topics Concern  . None   Social History Narrative     Current outpatient prescriptions:  .  Bisacodyl (DULCOLAX PO), Take by mouth., Disp: , Rfl:  .  ibuprofen (ADVIL,MOTRIN) 600 MG tablet, Take 1 tablet (600 mg total) by mouth every 6 (six) hours., Disp: 30 tablet, Rfl: 1 .  iron polysaccharides (NIFEREX) 150 MG capsule, Take 1 capsule (150 mg total) by mouth daily., Disp: 30 capsule, Rfl: 1 .  lidocaine (XYLOCAINE) 5 % ointment, Apply 1 application topically as needed., Disp: , Rfl:  .  fluticasone (FLONASE) 50 MCG/ACT nasal spray, Place 2 sprays into both nostrils daily., Disp: 16 g, Rfl: 6  EXAM:  Filed Vitals:   02/20/15 0820  BP: 102/68  Pulse: 70  Temp: 98.5 F (36.9 C)    Body mass index is 24.01 kg/(m^2).  GENERAL: vitals reviewed and  listed above, alert, oriented, appears well hydrated and in no acute distress  HEENT: atraumatic, conjunttiva clear, no obvious abnormalities on inspection of external nose and ears, normal appearance of ear canals and TMs, clear nasal congestion, boggy turbinates - pale,  mild post oropharyngeal erythema with PND, no tonsillar edema or exudate, no sinus TTP  NECK: no obvious masses on inspection  LUNGS: clear to auscultation bilaterally, no wheezes, rales or rhonchi, good air movement  CV: HRRR, no peripheral edema  MS: moves all extremities without noticeable abnormality  PSYCH: pleasant and cooperative, no obvious depression or anxiety  ASSESSMENT AND PLAN:  Discussed the following assessment and plan:  Chronic nonintractable headache, unspecified headache type - Plan: TSH, CBC with Differential  Allergic rhinitis, unspecified allergic rhinitis type  Constipation, unspecified constipation type  Hemorrhoids, unspecified hemorrhoid type  -flonase and antihistamine for allergies - she is to call/follow up if headaches persist -restart fiber supplement for constipation, she is to call/follow up if issues persist but this seemed to work well when she was taking it -Patient advised to return or notify a doctor immediately if symptoms worsen or persist or new concerns arise.  Patient Instructions  BEFORE YOU LEAVE: -schedule nurse visit for flu shot and lab visit for labs (NOT fasting) in the  next 1 week -follow up in 1-2 months  Start flonase 2 sprays each nostril daily for 1 month, then 1 spray each nostril daily  Allegra once daily  Continue the metameucil EVERY day and mirilax as needed - if any issues persist please call and we will place a referral to the gastroenterologist     [name].

## 2015-02-28 ENCOUNTER — Other Ambulatory Visit (INDEPENDENT_AMBULATORY_CARE_PROVIDER_SITE_OTHER): Payer: 59

## 2015-02-28 ENCOUNTER — Ambulatory Visit (INDEPENDENT_AMBULATORY_CARE_PROVIDER_SITE_OTHER): Payer: 59 | Admitting: *Deleted

## 2015-02-28 DIAGNOSIS — G8929 Other chronic pain: Secondary | ICD-10-CM

## 2015-02-28 DIAGNOSIS — Z23 Encounter for immunization: Secondary | ICD-10-CM | POA: Diagnosis not present

## 2015-02-28 DIAGNOSIS — R51 Headache: Secondary | ICD-10-CM

## 2015-02-28 LAB — CBC WITH DIFFERENTIAL/PLATELET
Basophils Absolute: 0 10*3/uL (ref 0.0–0.1)
Basophils Relative: 0.5 % (ref 0.0–3.0)
EOS PCT: 1.8 % (ref 0.0–5.0)
Eosinophils Absolute: 0.1 10*3/uL (ref 0.0–0.7)
HEMATOCRIT: 39.4 % (ref 36.0–46.0)
Hemoglobin: 13 g/dL (ref 12.0–15.0)
LYMPHS ABS: 2.5 10*3/uL (ref 0.7–4.0)
Lymphocytes Relative: 34.1 % (ref 12.0–46.0)
MCHC: 33.1 g/dL (ref 30.0–36.0)
MCV: 83.7 fl (ref 78.0–100.0)
MONOS PCT: 8.2 % (ref 3.0–12.0)
Monocytes Absolute: 0.6 10*3/uL (ref 0.1–1.0)
NEUTROS PCT: 55.4 % (ref 43.0–77.0)
Neutro Abs: 4 10*3/uL (ref 1.4–7.7)
Platelets: 309 10*3/uL (ref 150.0–400.0)
RBC: 4.71 Mil/uL (ref 3.87–5.11)
RDW: 13.4 % (ref 11.5–15.5)
WBC: 7.3 10*3/uL (ref 4.0–10.5)

## 2015-02-28 LAB — TSH: TSH: 1.48 u[IU]/mL (ref 0.35–4.50)

## 2015-03-11 ENCOUNTER — Ambulatory Visit (INDEPENDENT_AMBULATORY_CARE_PROVIDER_SITE_OTHER): Payer: 59 | Admitting: Family Medicine

## 2015-03-11 ENCOUNTER — Encounter: Payer: Self-pay | Admitting: Family Medicine

## 2015-03-11 VITALS — BP 100/60 | HR 69 | Temp 98.5°F | Ht 63.0 in | Wt 135.3 lb

## 2015-03-11 DIAGNOSIS — Z8669 Personal history of other diseases of the nervous system and sense organs: Secondary | ICD-10-CM

## 2015-03-11 MED ORDER — PROMETHAZINE HCL 25 MG/ML IJ SOLN
12.5000 mg | Freq: Once | INTRAMUSCULAR | Status: AC
  Administered 2015-03-11: 12.5 mg via INTRAMUSCULAR

## 2015-03-11 MED ORDER — KETOROLAC TROMETHAMINE 60 MG/2ML IM SOLN
30.0000 mg | Freq: Once | INTRAMUSCULAR | Status: AC
  Administered 2015-03-11: 30 mg via INTRAMUSCULAR

## 2015-03-11 NOTE — Progress Notes (Signed)
  HPI:  Acute visit for:  Migraine: -reports hx of migraines for > 10 years -headache, R sided parietal/frontal for 4 days, accompanied by nausea and photophobia -took some nyquil which helped some -denies: fevers, thick sinus congestion, malaise, neck pain, vision changes -has taken excedrin in the past for her migraines but reports did not know if she should take this  ROS: See pertinent positives and negatives per HPI.  Past Medical History  Diagnosis Date  . Headache(784.0)     frequent  . Migraine   . UTI (urinary tract infection)   . Constipation   . Mastitis     Past Surgical History  Procedure Laterality Date  . No past surgeries      Family History  Problem Relation Age of Onset  . Hyperlipidemia Father   . Colon cancer      grandparent  . Diabetes Other     Social History   Social History  . Marital Status: Married    Spouse Name: N/A  . Number of Children: N/A  . Years of Education: N/A   Social History Main Topics  . Smoking status: Never Smoker   . Smokeless tobacco: None  . Alcohol Use: No  . Drug Use: No  . Sexual Activity: Yes    Birth Control/ Protection: None   Other Topics Concern  . None   Social History Narrative     Current outpatient prescriptions:  .  fluticasone (FLONASE) 50 MCG/ACT nasal spray, Place 2 sprays into both nostrils daily., Disp: 16 g, Rfl: 6 .  Polyethylene Glycol 3350 (MIRALAX PO), Take by mouth., Disp: , Rfl:  .  Witch Hazel (PREPARATION H EX), Apply topically., Disp: , Rfl:   EXAM:  Filed Vitals:   03/11/15 0955  BP: 100/60  Pulse: 69  Temp: 98.5 F (36.9 C)    Body mass index is 23.97 kg/(m^2).  GENERAL: vitals reviewed and listed above, alert, oriented, appears well hydrated and in no acute distress  HEENT: atraumatic, conjunttiva clear, PERRLA, visual acuity grossly intact, normal inspection of ear canals, TMs, nasal membranes and oropharynx, no obvious abnormalities on inspection of external  nose and ears  NECK: no obvious masses on inspection, no meningeal signs  LUNGS: clear to auscultation bilaterally, no wheezes, rales or rhonchi, good air movement  CV: HRRR, no peripheral edema  MS: moves all extremities without noticeable abnormality  PSYCH: pleasant and cooperative, no obvious depression or anxiety  NEURO: CN II-XII grossly intact, finger to nose normal, giat normal, speech and thought processing grossly intact  ASSESSMENT AND PLAN:  Discussed the following assessment and plan:  Hx of migraines  -acute migraine, discussed options an dwill do toraldol/phenergan for acute symptoms -migraine journal and excedrin not more then 3 times per week as needed -f/u in 4 weeks, if increased frequency, worsening of headaches or not improving discussed would advise MRI -Patient advised to return or notify a doctor immediately if symptoms worsen or persist or new concerns arise.  Patient Instructions  Before your leave: -toradol and phenergan injection -schedule follow up in one month  Keep a journal of your Migraine Headaches  Take Excedrin as needed for your headaches, not more then 3 times per week     Louellen Haldeman R.

## 2015-03-11 NOTE — Patient Instructions (Signed)
Before your leave: -toradol and phenergan injection -schedule follow up in one month  Keep a journal of your Migraine Headaches  Take Excedrin as needed for your headaches, not more then 3 times per week

## 2015-03-11 NOTE — Addendum Note (Signed)
Addended by: Agnes Lawrence on: 03/11/2015 11:26 AM   Modules accepted: Orders

## 2015-03-11 NOTE — Progress Notes (Signed)
Pre visit review using our clinic review tool, if applicable. No additional management support is needed unless otherwise documented below in the visit note. 

## 2015-03-12 ENCOUNTER — Ambulatory Visit: Payer: 59 | Admitting: Family Medicine

## 2015-04-03 ENCOUNTER — Ambulatory Visit: Payer: 59 | Admitting: Family Medicine

## 2015-04-10 ENCOUNTER — Ambulatory Visit (INDEPENDENT_AMBULATORY_CARE_PROVIDER_SITE_OTHER): Payer: 59 | Admitting: Family Medicine

## 2015-04-10 ENCOUNTER — Encounter: Payer: Self-pay | Admitting: Family Medicine

## 2015-04-10 VITALS — BP 100/72 | HR 81 | Temp 98.2°F | Ht 63.0 in | Wt 137.5 lb

## 2015-04-10 DIAGNOSIS — Z8669 Personal history of other diseases of the nervous system and sense organs: Secondary | ICD-10-CM | POA: Diagnosis not present

## 2015-04-10 NOTE — Progress Notes (Signed)
  HPI:  Migraines: - > 10 years -had 4 day migrain last month aborted with toradol and phenergan -was supposed to keep trigger/frequency journal -reports: has not had any further headaches, feels like stress or allergies might be a trigger but is doing well without any headaches in last month -excedrin usually helps  ROS: See pertinent positives and negatives per HPI.  Past Medical History  Diagnosis Date  . Headache(784.0)     frequent  . Migraine   . UTI (urinary tract infection)   . Constipation   . Mastitis     Past Surgical History  Procedure Laterality Date  . No past surgeries      Family History  Problem Relation Age of Onset  . Hyperlipidemia Father   . Colon cancer      grandparent  . Diabetes Other     Social History   Social History  . Marital Status: Married    Spouse Name: N/A  . Number of Children: N/A  . Years of Education: N/A   Social History Main Topics  . Smoking status: Never Smoker   . Smokeless tobacco: None  . Alcohol Use: No  . Drug Use: No  . Sexual Activity: Yes    Birth Control/ Protection: None   Other Topics Concern  . None   Social History Narrative     Current outpatient prescriptions:  .  fluticasone (FLONASE) 50 MCG/ACT nasal spray, Place 2 sprays into both nostrils daily., Disp: 16 g, Rfl: 6 .  Polyethylene Glycol 3350 (MIRALAX PO), Take by mouth., Disp: , Rfl:  .  Witch Hazel (PREPARATION H EX), Apply topically., Disp: , Rfl:   EXAM:  Filed Vitals:   04/10/15 1119  BP: 100/72  Pulse: 81  Temp: 98.2 F (36.8 C)    Body mass index is 24.36 kg/(m^2).  GENERAL: vitals reviewed and listed above, alert, oriented, appears well hydrated and in no acute distress  HEENT: atraumatic, conjunttiva clear, no obvious abnormalities on inspection of external nose and ears  NECK: no obvious masses on inspection  LUNGS: clear to auscultation bilaterally, no wheezes, rales or rhonchi, good air movement  CV: HRRR, no  peripheral edema  MS: moves all extremities without noticeable abnormality  PSYCH: pleasant and cooperative, no obvious depression or anxiety  ASSESSMENT AND PLAN:  Discussed the following assessment and plan:  Hx of migraines  -discussed triggeres, treatment of triggers, treatment of migraines abortive and preventive - no headaches in a month so she plans to continue with PRN excedrin for now with follow up as needed -may consider allergy testing -Patient advised to return or notify a doctor immediately if symptoms worsen or persist or new concerns arise.  There are no Patient Instructions on file for this visit.   Colin Benton R.

## 2015-04-10 NOTE — Progress Notes (Signed)
Pre visit review using our clinic review tool, if applicable. No additional management support is needed unless otherwise documented below in the visit note. 

## 2015-07-09 ENCOUNTER — Telehealth: Payer: Self-pay | Admitting: Family Medicine

## 2015-07-09 NOTE — Telephone Encounter (Signed)
Spoke with pt, she starting to drink more water and she only had one episode of diarrhea and vomiting

## 2015-07-09 NOTE — Telephone Encounter (Signed)
Patient Name: Michele Gilbert  DOB: January 27, 1981    Initial Comment caller states she took Tamiflu on Sat and vomited today   Nurse Assessment  Nurse: Mallie Mussel, RN, Alveta Heimlich Date/Time Eilene Ghazi Time): 07/09/2015 1:31:44 PM  Confirm and document reason for call. If symptomatic, describe symptoms. You must click the next button to save text entered. ---Caller states that she began taking Tamiflu on Saturday. She began vomiting today, about 3 hours after this morning's dose. She has only vomited x 1. She last urinated 3 hours ago. She has diarrhea which began today. She has had diarrhea x 1. Denies fever. She still has congestion which is almost the same as when she was seen and diagnosed with the flu. Denies difficulty breathing.  Has the patient traveled out of the country within the last 30 days? ---No  Does the patient have any new or worsening symptoms? ---Yes  Will a triage be completed? ---Yes  Related visit to physician within the last 2 weeks? ---Yes  Does the PT have any chronic conditions? (i.e. diabetes, asthma, etc.) ---No  Is the patient pregnant or possibly pregnant? (Ask all females between the ages of 46-55) ---No  Is this a behavioral health or substance abuse call? ---No     Guidelines    Guideline Title Affirmed Question Affirmed Notes  Vomiting MILD vomiting with diarrhea (all triage questions negative)    Final Disposition User   [place], RN, Alveta Heimlich    Disagree/Comply: Leta Baptist

## 2015-07-09 NOTE — Telephone Encounter (Signed)
I called the pt and advised her per Dr Maudie Mercury this is a side effect of the flu or the Tamiflu and she did not prescribe this for the pt but she would be glad to see her if she needed to come in.  She stated she was seen at an urgent care this weekend and diagnosed with the flu and they gave her Tamiflu and her children have a stomach bug.  States she will check to see if she can get someone to keep her children and she may call back later if needed for an appt for tomorrow.

## 2016-03-03 ENCOUNTER — Telehealth: Payer: Self-pay | Admitting: Family Medicine

## 2016-03-03 NOTE — Telephone Encounter (Signed)
The patient called today needing a Rx for pink eye. She said that her son was diagnosed with pink eye Saturday and now she has pink eye.

## 2016-03-03 NOTE — Telephone Encounter (Signed)
I left a detailed message with the information below at the pts cell number. 

## 2016-03-03 NOTE — Telephone Encounter (Signed)
Most pink eye is viral and resolves with compresses and time (3-5 days). If she thinks she has bacteria pink eye - pus, pain, etc or she is not sure, can you help her schedule appt to eval? Thanks.

## 2016-06-09 ENCOUNTER — Ambulatory Visit (INDEPENDENT_AMBULATORY_CARE_PROVIDER_SITE_OTHER): Payer: 59 | Admitting: Family Medicine

## 2016-06-09 ENCOUNTER — Encounter: Payer: Self-pay | Admitting: Family Medicine

## 2016-06-09 VITALS — BP 98/70 | HR 70 | Temp 98.3°F | Ht 63.0 in | Wt 130.7 lb

## 2016-06-09 DIAGNOSIS — B9789 Other viral agents as the cause of diseases classified elsewhere: Secondary | ICD-10-CM

## 2016-06-09 DIAGNOSIS — Z23 Encounter for immunization: Secondary | ICD-10-CM

## 2016-06-09 DIAGNOSIS — J069 Acute upper respiratory infection, unspecified: Secondary | ICD-10-CM

## 2016-06-09 NOTE — Patient Instructions (Addendum)
BEFORE YOU LEAVE: -flu shot -follow up: Physical in 1-2 months   INSTRUCTIONS FOR UPPER RESPIRATORY INFECTION:  -plenty of rest and fluids  -nasal saline wash 2-3 times daily (use prepackaged nasal saline or bottled/distilled water if making your own)   -can use AFRIN nasal spray for drainage and nasal congestion - but do NOT use longer then 3-4 days  -can use tylenol (in no history of liver disease) or ibuprofen (if no history of kidney disease, bowel bleeding or significant heart disease) as directed for aches and sorethroat  -in the winter time, using a humidifier at night is helpful (please follow cleaning instructions)  -if you are taking a cough medication - use only as directed, may also try a teaspoon of honey to coat the throat and throat lozenges.   -for sore throat, salt water gargles can help  -follow up if you have fevers, facial pain, tooth pain, difficulty breathing or are worsening or symptoms persist longer then expected  Upper Respiratory Infection, Adult An upper respiratory infection (URI) is also known as the common cold. It is often caused by a type of germ (virus). Colds are easily spread (contagious). You can pass it to others by kissing, coughing, sneezing, or drinking out of the same glass. Usually, you get better in 1 to 3  weeks.  However, the cough can last for even longer. HOME CARE   Only take medicine as told by your doctor. Follow instructions provided above.  Drink enough water and fluids to keep your pee (urine) clear or pale yellow.  Get plenty of rest.  Return to work when your temperature is < 100 for 24 hours or as told by your doctor. You may use a face mask and wash your hands to stop your cold from spreading. GET HELP RIGHT AWAY IF:   After the first few days, you feel you are getting worse.  You have questions about your medicine.  You have chills, shortness of breath, or red spit (mucus).  You have pain in the face for more then  1-2 days, especially when you bend forward.  You have a fever, puffy (swollen) neck, pain when you swallow, or white spots in the back of your throat.  You have a bad headache, ear pain, sinus pain, or chest pain.  You have a high-pitched whistling sound when you breathe in and out (wheezing).  You cough up blood.  You have sore muscles or a stiff neck. MAKE SURE YOU:   Understand these instructions.  Will watch your condition.  Will get help right away if you are not doing well or get worse. Document Released: 10/07/2007 Document Revised: 07/13/2011 Document Reviewed: 07/26/2013 [facility] Patient Information 2015 [place], [place]. This information is not intended to replace advice given to you by your health care provider. Make sure you discuss any questions you have with your health care provider.

## 2016-06-09 NOTE — Progress Notes (Signed)
Pre visit review using our clinic review tool, if applicable. No additional management support is needed unless otherwise documented below in the visit note. 

## 2016-06-09 NOTE — Progress Notes (Signed)
HPI:  Acute visit for URI: -started: 2 days ago -symptoms:nasal congestion, sore throat, cough, ear pressure -denies:fever, SOB, NVD, tooth pain, body aches -has tried: nothing -sick contacts/travel/risks: no reported flu, strep or tick exposure - husband and children with similar symptoms -wants flu shot  ROS: See pertinent positives and negatives per HPI.  Past Medical History:  Diagnosis Date  . Constipation   . Headache(784.0)    frequent  . Mastitis   . Migraine   . UTI (urinary tract infection)     Past Surgical History:  Procedure Laterality Date  . NO PAST SURGERIES      Family History  Problem Relation Age of Onset  . Hyperlipidemia Father   . Colon cancer      grandparent  . Diabetes Other     Social History   Social History  . Marital status: Married    Spouse name: N/A  . Number of children: N/A  . Years of education: N/A   Social History Main Topics  . Smoking status: Never Smoker  . Smokeless tobacco: Never Used  . Alcohol use No  . Drug use: No  . Sexual activity: Yes    Birth control/ protection: None   Other Topics Concern  . None   Social History Narrative  . None     Current Outpatient Prescriptions:  .  CAMILA 0.35 MG tablet, , Disp: , Rfl:  .  OVER THE COUNTER MEDICATION, Focus factor, Disp: , Rfl:  .  OVER THE COUNTER MEDICATION, Collagen, Disp: , Rfl:   EXAM:  Vitals:   06/09/16 1010  BP: 98/70  Pulse: 70  Temp: 98.3 F (36.8 C)    Body mass index is 23.15 kg/m.  GENERAL: vitals reviewed and listed above, alert, oriented, appears well hydrated and in no acute distress  HEENT: atraumatic, conjunttiva clear, no obvious abnormalities on inspection of external nose and ears, normal appearance of ear canals and TMs, clear nasal congestion, mild post oropharyngeal erythema with PND, no tonsillar edema or exudate, no sinus TTP  NECK: no obvious masses on inspection  LUNGS: clear to auscultation bilaterally, no  wheezes, rales or rhonchi, good air movement  CV: HRRR, no peripheral edema  MS: moves all extremities without noticeable abnormality  PSYCH: pleasant and cooperative, no obvious depression or anxiety  ASSESSMENT AND PLAN:  Discussed the following assessment and plan:  Viral upper respiratory tract infection  -given HPI and exam findings today, a serious infection or illness is unlikely. We discussed potential etiologies, with VURI being most likely, and advised supportive care and monitoring. We discussed treatment side effects, likely course, antibiotic misuse, transmission, and signs of developing a serious illness. -of course, we advised to return or notify a doctor immediately if symptoms worsen or persist or new concerns arise.    Patient Instructions  BEFORE YOU LEAVE: -flu shot -follow up: Physical in 1-2 months   INSTRUCTIONS FOR UPPER RESPIRATORY INFECTION:  -plenty of rest and fluids  -nasal saline wash 2-3 times daily (use prepackaged nasal saline or bottled/distilled water if making your own)   -can use AFRIN nasal spray for drainage and nasal congestion - but do NOT use longer then 3-4 days  -can use tylenol (in no history of liver disease) or ibuprofen (if no history of kidney disease, bowel bleeding or significant heart disease) as directed for aches and sorethroat  -in the winter time, using a humidifier at night is helpful (please follow cleaning instructions)  -if you are  taking a cough medication - use only as directed, may also try a teaspoon of honey to coat the throat and throat lozenges.   -for sore throat, salt water gargles can help  -follow up if you have fevers, facial pain, tooth pain, difficulty breathing or are worsening or symptoms persist longer then expected  Upper Respiratory Infection, Adult An upper respiratory infection (URI) is also known as the common cold. It is often caused by a type of germ (virus). Colds are easily spread  (contagious). You can pass it to others by kissing, coughing, sneezing, or drinking out of the same glass. Usually, you get better in 1 to 3  weeks.  However, the cough can last for even longer. HOME CARE   Only take medicine as told by your doctor. Follow instructions provided above.  Drink enough water and fluids to keep your pee (urine) clear or pale yellow.  Get plenty of rest.  Return to work when your temperature is < 100 for 24 hours or as told by your doctor. You may use a face mask and wash your hands to stop your cold from spreading. GET HELP RIGHT AWAY IF:   After the first few days, you feel you are getting worse.  You have questions about your medicine.  You have chills, shortness of breath, or red spit (mucus).  You have pain in the face for more then 1-2 days, especially when you bend forward.  You have a fever, puffy (swollen) neck, pain when you swallow, or white spots in the back of your throat.  You have a bad headache, ear pain, sinus pain, or chest pain.  You have a high-pitched whistling sound when you breathe in and out (wheezing).  You cough up blood.  You have sore muscles or a stiff neck. MAKE SURE YOU:   Understand these instructions.  Will watch your condition.  Will get help right away if you are not doing well or get worse. Document Released: 10/07/2007 Document Revised: 07/13/2011 Document Reviewed: 07/26/2013 [facility] Patient Information 2015 [place], [place]. This information is not intended to replace advice given to you by your health care provider. Make sure you discuss any questions you have with your health care provider.    Colin Benton R., DO

## 2016-08-07 ENCOUNTER — Encounter: Payer: 59 | Admitting: Family Medicine

## 2016-08-13 ENCOUNTER — Encounter: Payer: Self-pay | Admitting: Family Medicine

## 2016-08-13 ENCOUNTER — Ambulatory Visit (INDEPENDENT_AMBULATORY_CARE_PROVIDER_SITE_OTHER): Payer: 59 | Admitting: Family Medicine

## 2016-08-13 VITALS — BP 98/70 | HR 69 | Temp 98.4°F | Ht 63.0 in | Wt 130.2 lb

## 2016-08-13 DIAGNOSIS — F909 Attention-deficit hyperactivity disorder, unspecified type: Secondary | ICD-10-CM | POA: Diagnosis not present

## 2016-08-13 DIAGNOSIS — F411 Generalized anxiety disorder: Secondary | ICD-10-CM

## 2016-08-13 DIAGNOSIS — F339 Major depressive disorder, recurrent, unspecified: Secondary | ICD-10-CM | POA: Diagnosis not present

## 2016-08-13 MED ORDER — AMPHETAMINE-DEXTROAMPHETAMINE 5 MG PO TABS: 5.0000 mg | ORAL_TABLET | Freq: Every day | ORAL | 0 refills | Status: DC

## 2016-08-13 NOTE — Progress Notes (Signed)
HPI:  Acute visit for "ADHD": -reports prior dx and in the past when in school symptoms helped significantly by brief course of adderall -symptoms for "my whole life" -did ok in school as a child, but reports only when she wanted to -during adult lifes reports whenever stress, can't focus, can't complete tasks, can try over an dover again to focus to read for test prep and can't -currently 3 weeks away from completing a course and feels need adderall for a few weeks, she is aware of risks -she has some mild depressed mood, irritability and generalized anxiety chronically - especially during times of increased stress -reports saw psychologist remotely -does want to do testing for ADHD, other mood disorders and treatment for mood d/o once over this class -no SI, thoughts of harm to herself or others, manic symptoms, HTN, tics, heat disease, hallucinations, severe depression, psychosis, panic attacks -no longer breat feeding  ROS: See pertinent positives and negatives per HPI.  Past Medical History:  Diagnosis Date  . Constipation   . Headache(784.0)    frequent  . Mastitis   . Migraine   . UTI (urinary tract infection)     Past Surgical History:  Procedure Laterality Date  . NO PAST SURGERIES      Family History  Problem Relation Age of Onset  . Hyperlipidemia Father   . Colon cancer      grandparent  . Diabetes Other     Social History   Social History  . Marital status: Married    Spouse name: N/A  . Number of children: N/A  . Years of education: N/A   Social History Main Topics  . Smoking status: Never Smoker  . Smokeless tobacco: Never Used  . Alcohol use No  . Drug use: No  . Sexual activity: Yes    Birth control/ protection: None   Other Topics Concern  . None   Social History Narrative  . None     Current Outpatient Prescriptions:  .  CAMILA 0.35 MG tablet, , Disp: , Rfl:  .  OVER THE COUNTER MEDICATION, Focus factor, Disp: , Rfl:  .  OVER THE  COUNTER MEDICATION, Collagen, Disp: , Rfl:  .  amphetamine-dextroamphetamine (ADDERALL) 5 MG tablet, Take 1 tablet (5 mg total) by mouth daily., Disp: 21 tablet, Rfl: 0  EXAM:  Vitals:   08/13/16 0827  BP: 98/70  Pulse: 69  Temp: 98.4 F (36.9 C)    Body mass index is 23.06 kg/m.  GENERAL: vitals reviewed and listed above, alert, oriented, appears well hydrated and in no acute distress  HEENT: atraumatic, conjunttiva clear, no obvious abnormalities on inspection of external nose and ears  NECK: no obvious masses on inspection  LUNGS: clear to auscultation bilaterally, no wheezes, rales or rhonchi, good air movement  CV: HRRR, no peripheral edema  MS: moves all extremities without noticeable abnormality  PSYCH: pleasant and cooperative, no obvious depression or anxiety, no fidgeting, good eye contact, speech and thought processing grossly intact  ASSESSMENT AND PLAN:  Discussed the following assessment and plan:  Attention deficit hyperactivity disorder (ADHD), unspecified ADHD type -perhaps has this dx, versus focus issues related to stress/GAD/mild depression -she is adamant about taking stimulant for a very short time to complete semester as found it very beneficial in the past -we had a lengthy discussion of risks with stimulants vs benefits and I advised I do not rx for long term use in adults, advised testing with Dr. Glennon Hamilton and treatment of  likely anxiety and depression, adequate sleep, regular exercise, CBT and healthy diet before using stimulants long term for focus issues - she is in agreement to only use for a few weeks in this pinch and then pursue testing, other treatment options  Depression, recurrent ([place]) -advised CBT, consideration SSRI if persistent issues -no severe symptoms -advise prompt attention if any worsening  GAD (generalized anxiety disorder) -CBT and consideration SSRI  Follow up 1 month. Sooner as needed.  -Patient advised to return or  notify a doctor immediately if symptoms worsen or persist or new concerns arise.  Patient Instructions  BEFORE YOU LEAVE: -follow up: 30 minutes in 1 month  Can use the Adderall for the next few weeks. Must follow up in 1 month and we will get you plugged in for testing with our psychologist.  Corliss Blacker of regular aerobic exercise.  Adequate sleep - at least 7 hours per night.  Amphetamine; Dextroamphetamine tablets What is this medicine? AMPHETAMINE; DEXTROAMPHETAMINE(am FET a meen; dex troe am FET a meen) is used to treat attention-deficit hyperactivity disorder (ADHD). It may also be used for narcolepsy. Federal law prohibits giving this medicine to any person other than the person for whom it was prescribed. Do not share this medicine with anyone else. This medicine may be used for other purposes; ask your health care provider or pharmacist if you have questions. COMMON BRAND NAME(S): Adderall What should I tell my health care provider before I take this medicine? They need to know if you have any of these conditions: -anxiety or panic attacks -circulation problems in fingers and toes -glaucoma -hardening or blockages of the arteries or heart blood vessels -heart disease or a heart defect -high blood pressure -history of a drug or alcohol abuse problem -history of stroke -kidney disease -liver disease -mental illness -seizures -suicidal thoughts, plans, or attempt; a previous suicide attempt by you or a family member -thyroid disease -Tourette's syndrome -an unusual or allergic reaction to dextroamphetamine, other amphetamines, other medicines, foods, dyes, or preservatives -pregnant or trying to get pregnant -breast-feeding How should I use this medicine? Take this medicine by mouth with a glass of water. Follow the directions on the prescription label. Take your doses at regular intervals. Do not take your medicine more often than directed. Do not suddenly stop your medicine.  You must gradually reduce the dose or you may feel withdrawal effects. Ask your doctor or health care professional for advice. Talk to your pediatrician regarding the use of this medicine in children. Special care may be needed. While this drug may be prescribed for children as young as 3 years for selected conditions, precautions do apply. Overdosage: If you think you have taken too much of this medicine contact a poison control center or emergency room at once. NOTE: This medicine is only for you. Do not share this medicine with others. What if I miss a dose? If you miss a dose, take it as soon as you can. If it is almost time for your next dose, take only that dose. Do not take double or extra doses. What may interact with this medicine? Do not take this medicine with any of the following medications: -MAOIS like Carbex, Eldepryl, Marplan, Nardil, and Parnate -other stimulant medicines for attention disorders, weight loss, or to stay awake This medicine may also interact with the following medications: -acetazolamide -ammonium chloride -antacids -ascorbic acid -atomoxetine -caffeine -certain medicines for blood pressure -certain medicines for depression, anxiety, or psychotic disturbances -certain medicines for seizures  like carbamazepine, phenobarbital, phenytoin -certain medicines for stomach problems like cimetidine, famotidine, omeprazole, lansoprazole -cold or allergy medicines -glutamic acid -lithium -meperidine -methenamine; sodium acid phosphate -narcotic medicines for pain -norepinephrine -phenothiazines like chlorpromazine, mesoridazine, prochlorperazine, thioridazine -sodium acid phosphate -sodium bicarbonate This list may not describe all possible interactions. Give your health care provider a list of all the medicines, herbs, non-prescription drugs, or dietary supplements you use. Also tell them if you smoke, drink alcohol, or use illegal drugs. Some items may interact  with your medicine. What should I watch for while using this medicine? Visit your doctor or health care professional for regular checks on your progress. This prescription requires that you follow special procedures with your doctor and pharmacy. You will need to have a new written prescription from your doctor every time you need a refill. This medicine may affect your concentration, or hide signs of tiredness. Until you know how this medicine affects you, do not drive, ride a bicycle, use machinery, or do anything that needs mental alertness. Tell your doctor or health care professional if this medicine loses its effects, or if you feel you need to take more than the prescribed amount. Do not change the dosage without talking to your doctor or health care professional. Decreased appetite is a common side effect when starting this medicine. Eating small, frequent meals or snacks can help. Talk to your doctor if you continue to have poor eating habits. Height and weight growth of a child taking this medicine will be monitored closely. Do not take this medicine close to bedtime. It may prevent you from sleeping. If you are going to need surgery, a MRI, CT scan, or other procedure, tell your doctor that you are taking this medicine. You may need to stop taking this medicine before the procedure. Tell your doctor or healthcare professional right away if you notice unexplained wounds on your fingers and toes while taking this medicine. You should also tell your healthcare provider if you experience numbness or pain, changes in the skin color, or sensitivity to temperature in your fingers or toes. What side effects may I notice from receiving this medicine? Side effects that you should report to your doctor or health care professional as soon as possible: -allergic reactions like skin rash, itching or hives, swelling of the face, lips, or tongue -changes in vision -chest pain or chest tightness -confusion,  trouble speaking or understanding -fast, irregular heartbeat -fingers or toes feel numb, cool, painful -hallucination, loss of contact with reality -high blood pressure -males: prolonged or painful erection -seizures -severe headaches -shortness of breath -suicidal thoughts or other mood changes -trouble walking, dizziness, loss of balance or coordination -uncontrollable head, mouth, neck, arm, or leg movements Side effects that usually do not require medical attention (report to your doctor or health care professional if they continue or are bothersome): -anxious -headache -loss of appetite -nausea, vomiting -trouble sleeping -weight loss This list may not describe all possible side effects. Call your doctor for medical advice about side effects. You may report side effects to FDA at 1-800-FDA-1088. Where should I keep my medicine? Keep out of the reach of children. This medicine can be abused. Keep your medicine in a safe place to protect it from theft. Do not share this medicine with anyone. Selling or giving away this medicine is dangerous and against the law. Store at room temperature between 15 and 30 degrees C (59 and 86 degrees F). Keep container tightly closed. Throw away any unused medicine  after the expiration date. Dispose of properly. This medicine may cause accidental overdose and death if it is taken by other adults, children, or pets. Mix any unused medicine with a substance like cat litter or coffee grounds. Then throw the medicine away in a sealed container like a sealed bag or a coffee can with a lid. Do not use the medicine after the expiration date. NOTE: This sheet is a summary. It may not cover all possible information. If you have questions about this medicine, talk to your doctor, pharmacist, or health care provider.  2018 Elsevier/Gold Standard (2014-02-21 18:44:41)       Colin Benton R., DO

## 2016-08-13 NOTE — Patient Instructions (Signed)
BEFORE YOU LEAVE: -follow up: 30 minutes in 1 month  Can use the Adderall for the next few weeks. Must follow up in 1 month and we will get you plugged in for testing with our psychologist.  Corliss Blacker of regular aerobic exercise.  Adequate sleep - at least 7 hours per night.  Amphetamine; Dextroamphetamine tablets What is this medicine? AMPHETAMINE; DEXTROAMPHETAMINE(am FET a meen; dex troe am FET a meen) is used to treat attention-deficit hyperactivity disorder (ADHD). It may also be used for narcolepsy. Federal law prohibits giving this medicine to any person other than the person for whom it was prescribed. Do not share this medicine with anyone else. This medicine may be used for other purposes; ask your health care provider or pharmacist if you have questions. COMMON BRAND NAME(S): Adderall What should I tell my health care provider before I take this medicine? They need to know if you have any of these conditions: -anxiety or panic attacks -circulation problems in fingers and toes -glaucoma -hardening or blockages of the arteries or heart blood vessels -heart disease or a heart defect -high blood pressure -history of a drug or alcohol abuse problem -history of stroke -kidney disease -liver disease -mental illness -seizures -suicidal thoughts, plans, or attempt; a previous suicide attempt by you or a family member -thyroid disease -Tourette's syndrome -an unusual or allergic reaction to dextroamphetamine, other amphetamines, other medicines, foods, dyes, or preservatives -pregnant or trying to get pregnant -breast-feeding How should I use this medicine? Take this medicine by mouth with a glass of water. Follow the directions on the prescription label. Take your doses at regular intervals. Do not take your medicine more often than directed. Do not suddenly stop your medicine. You must gradually reduce the dose or you may feel withdrawal effects. Ask your doctor or health care  professional for advice. Talk to your pediatrician regarding the use of this medicine in children. Special care may be needed. While this drug may be prescribed for children as young as 3 years for selected conditions, precautions do apply. Overdosage: If you think you have taken too much of this medicine contact a poison control center or emergency room at once. NOTE: This medicine is only for you. Do not share this medicine with others. What if I miss a dose? If you miss a dose, take it as soon as you can. If it is almost time for your next dose, take only that dose. Do not take double or extra doses. What may interact with this medicine? Do not take this medicine with any of the following medications: -MAOIS like Carbex, Eldepryl, Marplan, Nardil, and Parnate -other stimulant medicines for attention disorders, weight loss, or to stay awake This medicine may also interact with the following medications: -acetazolamide -ammonium chloride -antacids -ascorbic acid -atomoxetine -caffeine -certain medicines for blood pressure -certain medicines for depression, anxiety, or psychotic disturbances -certain medicines for seizures like carbamazepine, phenobarbital, phenytoin -certain medicines for stomach problems like cimetidine, famotidine, omeprazole, lansoprazole -cold or allergy medicines -glutamic acid -lithium -meperidine -methenamine; sodium acid phosphate -narcotic medicines for pain -norepinephrine -phenothiazines like chlorpromazine, mesoridazine, prochlorperazine, thioridazine -sodium acid phosphate -sodium bicarbonate This list may not describe all possible interactions. Give your health care provider a list of all the medicines, herbs, non-prescription drugs, or dietary supplements you use. Also tell them if you smoke, drink alcohol, or use illegal drugs. Some items may interact with your medicine. What should I watch for while using this medicine? Visit your doctor or health  care professional for regular checks on your progress. This prescription requires that you follow special procedures with your doctor and pharmacy. You will need to have a new written prescription from your doctor every time you need a refill. This medicine may affect your concentration, or hide signs of tiredness. Until you know how this medicine affects you, do not drive, ride a bicycle, use machinery, or do anything that needs mental alertness. Tell your doctor or health care professional if this medicine loses its effects, or if you feel you need to take more than the prescribed amount. Do not change the dosage without talking to your doctor or health care professional. Decreased appetite is a common side effect when starting this medicine. Eating small, frequent meals or snacks can help. Talk to your doctor if you continue to have poor eating habits. Height and weight growth of a child taking this medicine will be monitored closely. Do not take this medicine close to bedtime. It may prevent you from sleeping. If you are going to need surgery, a MRI, CT scan, or other procedure, tell your doctor that you are taking this medicine. You may need to stop taking this medicine before the procedure. Tell your doctor or healthcare professional right away if you notice unexplained wounds on your fingers and toes while taking this medicine. You should also tell your healthcare provider if you experience numbness or pain, changes in the skin color, or sensitivity to temperature in your fingers or toes. What side effects may I notice from receiving this medicine? Side effects that you should report to your doctor or health care professional as soon as possible: -allergic reactions like skin rash, itching or hives, swelling of the face, lips, or tongue -changes in vision -chest pain or chest tightness -confusion, trouble speaking or understanding -fast, irregular heartbeat -fingers or toes feel numb, cool,  painful -hallucination, loss of contact with reality -high blood pressure -males: prolonged or painful erection -seizures -severe headaches -shortness of breath -suicidal thoughts or other mood changes -trouble walking, dizziness, loss of balance or coordination -uncontrollable head, mouth, neck, arm, or leg movements Side effects that usually do not require medical attention (report to your doctor or health care professional if they continue or are bothersome): -anxious -headache -loss of appetite -nausea, vomiting -trouble sleeping -weight loss This list may not describe all possible side effects. Call your doctor for medical advice about side effects. You may report side effects to FDA at 1-800-FDA-1088. Where should I keep my medicine? Keep out of the reach of children. This medicine can be abused. Keep your medicine in a safe place to protect it from theft. Do not share this medicine with anyone. Selling or giving away this medicine is dangerous and against the law. Store at room temperature between 15 and 30 degrees C (59 and 86 degrees F). Keep container tightly closed. Throw away any unused medicine after the expiration date. Dispose of properly. This medicine may cause accidental overdose and death if it is taken by other adults, children, or pets. Mix any unused medicine with a substance like cat litter or coffee grounds. Then throw the medicine away in a sealed container like a sealed bag or a coffee can with a lid. Do not use the medicine after the expiration date. NOTE: This sheet is a summary. It may not cover all possible information. If you have questions about this medicine, talk to your doctor, pharmacist, or health care provider.  2018 Elsevier/Gold Standard (2014-02-21 18:44:41)

## 2016-08-13 NOTE — Progress Notes (Signed)
Pre visit review using our clinic review tool, if applicable. No additional management support is needed unless otherwise documented below in the visit note. 

## 2016-09-15 ENCOUNTER — Encounter: Payer: Self-pay | Admitting: Family Medicine

## 2016-09-15 NOTE — Progress Notes (Signed)
HPI:  Here for CPE:  Due for ?pap, labs, tetanus Declines pap here as wants to do with her gynecologist. She agrees to set this up. Also declines pelvic or breast exam here. She wants to check cbc and thyroid as feels tired sometimes and reports issues with these in the past.  -Concerns and/or follow up today:   GAD/Depression/Focus issues: -request short course stimulant (worked in the past) to get thru exams at [facility] 08/2016 -agreed to testing, CBT tx Anx/Dep once courses over   -Diet: variety of foods, balance and well rounded  -Exercise: no regular exercise  -Taking folic acid, vitamin D or calcium: no  -Diabetes and Dyslipidemia Screening: fasting for labs  -Hx of HTN: no  -Vaccines: UTD  -pap history: reports normal about 3 years ago  -FDLMP: 2 weeks ago, normal and regular  -sexual activity: yes, female partner, no new partners  -wants STI testing (Hep C if born [birth year]): no  -FH breast, colon or ovarian ca: see FH Last mammogram: n/a Last colon cancer screening: n/a   -Alcohol, Tobacco, drug use: see social history  Review of Systems - no fevers, unintentional weight loss, vision loss, hearing loss, chest pain, sob, hemoptysis, melena, hematochezia, hematuria, genital discharge, changing or concerning skin lesions, bleeding, bruising, loc, thoughts of self harm or SI  Past Medical History:  Diagnosis Date  . Constipation   . Headache(784.0)    frequent  . Mastitis   . Migraine   . UTI (urinary tract infection)     Past Surgical History:  Procedure Laterality Date  . NO PAST SURGERIES      Family History  Problem Relation Age of Onset  . Hyperlipidemia Father   . Colon cancer Unknown        grandparent  . Diabetes Other     Social History   Social History  . Marital status: Married    Spouse name: N/A  . Number of children: N/A  . Years of education: N/A   Social History Main Topics  . Smoking status: Never Smoker  . Smokeless tobacco:  Never Used  . Alcohol use No  . Drug use: No  . Sexual activity: Yes    Birth control/ protection: None   Other Topics Concern  . Not on file   Social History Narrative  . No narrative on file     Current Outpatient Prescriptions:  .  amphetamine-dextroamphetamine (ADDERALL) 5 MG tablet, Take 1 tablet (5 mg total) by mouth daily., Disp: 21 tablet, Rfl: 0 .  CAMILA 0.35 MG tablet, , Disp: , Rfl:  .  OVER THE COUNTER MEDICATION, Focus factor, Disp: , Rfl:  .  OVER THE COUNTER MEDICATION, Collagen, Disp: , Rfl:   EXAM:  There were no vitals filed for this visit.  GENERAL: vitals reviewed and listed below, alert, oriented, appears well hydrated and in no acute distress  HEENT: head atraumatic, PERRLA, normal appearance of eyes, ears, nose and mouth. moist mucus membranes.  NECK: supple, no masses or lymphadenopathy  LUNGS: clear to auscultation bilaterally, no rales, rhonchi or wheeze  CV: HRRR, no peripheral edema or cyanosis, normal pedal pulses  ABDOMEN: bowel sounds normal, soft, non tender to palpation, no masses, no rebound or guarding  GU/BREAST:declined  SKIN: no rash or abnormal lesions except for cluster small dark brown macules R upper arm  MS: normal gait, moves all extremities normally  NEURO: normal gait, speech and thought processing grossly intact, muscle tone grossly intact throughout  PSYCH:  normal affect, pleasant and cooperative  ASSESSMENT AND PLAN:  Discussed the following assessment and plan:  Encounter for preventive care - Plan: Basic metabolic panel, CBC, Hemoglobin A1c, HDL cholesterol, Cholesterol, total  Atypical nevus -advised prompt eval, discussed potential etiologies, she prefers to biopsy here rather then with derm  Other fatigue - Plan: VITAMIN D 25 Hydroxy (Vit-D Deficiency, Fractures), TSH  -she plans to seen gyn for pap, breast exam, pelvic  -Discussed and advised all [place] preventive services health task force level A and B  recommendations for age, sex and risks.  -Advised at least 150 minutes of exercise per week and a healthy diet with avoidance of (less then 1 serving per week) processed foods, white starches, red meat, fast foods and sweets and consisting of: * 5-9 servings of fresh fruits and vegetables (not corn or potatoes) *nuts and seeds, beans *olives and olive oil *lean meats such as fish and white chicken  *whole grains  -labs, studies and vaccines per orders this encounter  No orders of the defined types were placed in this encounter.   Patient advised to return to clinic immediately if symptoms worsen or persist or new concerns.  There are no Patient Instructions on file for this visit.  No Follow-up on file.  Colin Benton R., DO

## 2016-09-17 ENCOUNTER — Encounter: Payer: Self-pay | Admitting: Family Medicine

## 2016-09-17 ENCOUNTER — Ambulatory Visit (INDEPENDENT_AMBULATORY_CARE_PROVIDER_SITE_OTHER): Payer: 59 | Admitting: Family Medicine

## 2016-09-17 VITALS — BP 82/60 | HR 68 | Temp 98.3°F | Ht 63.25 in | Wt 131.7 lb

## 2016-09-17 DIAGNOSIS — Z0000 Encounter for general adult medical examination without abnormal findings: Secondary | ICD-10-CM | POA: Diagnosis not present

## 2016-09-17 DIAGNOSIS — D229 Melanocytic nevi, unspecified: Secondary | ICD-10-CM

## 2016-09-17 DIAGNOSIS — R5383 Other fatigue: Secondary | ICD-10-CM | POA: Diagnosis not present

## 2016-09-17 LAB — HEMOGLOBIN A1C: HEMOGLOBIN A1C: 5.6 % (ref 4.6–6.5)

## 2016-09-17 LAB — CBC
HCT: 39.3 % (ref 36.0–46.0)
Hemoglobin: 13.1 g/dL (ref 12.0–15.0)
MCHC: 33.2 g/dL (ref 30.0–36.0)
MCV: 83.6 fl (ref 78.0–100.0)
Platelets: 340 10*3/uL (ref 150.0–400.0)
RBC: 4.7 Mil/uL (ref 3.87–5.11)
RDW: 14.4 % (ref 11.5–15.5)
WBC: 9.8 10*3/uL (ref 4.0–10.5)

## 2016-09-17 LAB — BASIC METABOLIC PANEL
BUN: 13 mg/dL (ref 6–23)
CO2: 29 mEq/L (ref 19–32)
CREATININE: 0.64 mg/dL (ref 0.40–1.20)
Calcium: 9.6 mg/dL (ref 8.4–10.5)
Chloride: 103 mEq/L (ref 96–112)
GFR: 111.16 mL/min (ref >60.00)
GLUCOSE: 75 mg/dL (ref 70–99)
POTASSIUM: 4.6 meq/L (ref 3.5–5.1)
Sodium: 140 mEq/L (ref 135–145)

## 2016-09-17 LAB — TSH: TSH: 2.69 u[IU]/mL (ref 0.35–4.50)

## 2016-09-17 LAB — CHOLESTEROL, TOTAL: Cholesterol: 179 mg/dL (ref 0–200)

## 2016-09-17 LAB — HDL CHOLESTEROL: HDL: 48 mg/dL (ref >39.00)

## 2016-09-17 LAB — VITAMIN D 25 HYDROXY (VIT D DEFICIENCY, FRACTURES): VITD: 18.17 ng/mL — AB (ref 30.00–100.00)

## 2016-09-17 NOTE — Patient Instructions (Signed)
BEFORE YOU LEAVE: -tetanus booster -labs -follow up: procedure visit to biopsy abnormal mole in next 1 week  Call your gynecologist today to schedule your pap smear and breast exam.  We have ordered labs or studies at this visit. It can take up to 1-2 weeks for results and processing. IF results require follow up or explanation, we will call you with instructions. Clinically stable results will be released to your [facility]. If you have not heard from [place] or cannot find your results in [facility] in 2 weeks please contact our office at [phone].  If you are not yet signed up for [facility], please consider signing up.   We recommend the following healthy lifestyle for LIFE: 1) Small portions.   Tip: eat off of a salad plate instead of a dinner plate.  Tip: if you need more or a snack choose fruits, veggies and/or a handful of nuts or seeds.  2) Eat a healthy clean diet.  * Tip: Avoid (less then 1 serving per week): processed foods, sweets, sweetened drinks, white starches (rice, flour, bread, potatoes, pasta, etc), red meat, fast foods, butter  *Tip: CHOOSE instead   * 5-9 servings per day of fresh or frozen fruits and vegetables (but not corn, potatoes, bananas, canned or dried fruit)   *nuts and seeds, beans   *olives and olive oil   *small portions of lean meats such as fish and white chicken    *small portions of whole grains  3)Get at least 150 minutes of sweaty aerobic exercise per week.  4)Reduce stress - consider counseling, meditation and relaxation to balance other aspects of your life.

## 2016-09-18 ENCOUNTER — Encounter (INDEPENDENT_AMBULATORY_CARE_PROVIDER_SITE_OTHER): Payer: Self-pay

## 2016-09-18 ENCOUNTER — Encounter: Payer: Self-pay | Admitting: Family Medicine

## 2016-09-18 ENCOUNTER — Ambulatory Visit (INDEPENDENT_AMBULATORY_CARE_PROVIDER_SITE_OTHER): Payer: 59 | Admitting: Family Medicine

## 2016-09-18 ENCOUNTER — Encounter: Payer: Self-pay | Admitting: *Deleted

## 2016-09-18 VITALS — BP 90/60 | HR 84 | Temp 98.5°F | Ht 63.25 in | Wt 137.9 lb

## 2016-09-18 DIAGNOSIS — D2261 Melanocytic nevi of right upper limb, including shoulder: Secondary | ICD-10-CM | POA: Diagnosis not present

## 2016-09-18 DIAGNOSIS — L814 Other melanin hyperpigmentation: Secondary | ICD-10-CM | POA: Diagnosis not present

## 2016-09-18 DIAGNOSIS — D229 Melanocytic nevi, unspecified: Secondary | ICD-10-CM

## 2016-09-18 NOTE — Progress Notes (Signed)
HPI:  Here for removal abnormal mole. R upper arm. She feels has enlarged over time. No pain/pruritis or other symptoms. No FH or personal hx skin ca. She will be leaving for [place] in 3 days and prefers that we send her results, even if abnormal, thru Mychart rather then calling. She agrees to call us in 1 week if has not received her results.  ROS: See pertinent positives and negatives per HPI.  Past Medical History:  Diagnosis Date  . Constipation   . Headache(784.0)    frequent  . Mastitis   . Migraine   . UTI (urinary tract infection)     Past Surgical History:  Procedure Laterality Date  . NO PAST SURGERIES      Family History  Problem Relation Age of Onset  . Hyperlipidemia Father   . Colon cancer Unknown        grandparent  . Diabetes Other     Social History   Social History  . Marital status: Married    Spouse name: N/A  . Number of children: N/A  . Years of education: N/A   Social History Main Topics  . Smoking status: Never Smoker  . Smokeless tobacco: Never Used  . Alcohol use No  . Drug use: No  . Sexual activity: Yes    Birth control/ protection: None   Other Topics Concern  . None   Social History Narrative   Work or School:      Home Situation: lives with husband and 2 children (3 and 27 yo in 2018)   From [place]      Spiritual Beliefs:      Lifestyle:        Current Outpatient Prescriptions:  .  Bisacodyl (DULCOLAX PO), Take by mouth as needed., Disp: , Rfl:  .  CAMILA 0.35 MG tablet, , Disp: , Rfl:   EXAM:  Vitals:   09/18/16 1017  BP: 90/60  Pulse: 84  Temp: 98.5 F (36.9 C)    Body mass index is 24.24 kg/m.  GENERAL: vitals reviewed and listed above, alert, oriented, appears well hydrated and in no acute distress  SKIN: cluster of pinpoint sized dark brown macules R upper lateral arm approx 4x65mm in diameter  PSYCH: pleasant and cooperative, no obvious depression or anxiety  ASSESSMENT AND  PLAN:  Discussed the following assessment and plan:  Atypical nevus - Plan: Dermatology pathology  -we discussed possible serious and likely etiologies, workup and treatment, treatment risks and return precautions -after this discussion, Tashi opted for shave biopsy, understand may not remove entire lesion (though will try), need for further follow up and dermatology treatment if abnormal or cancer -she agrees to call us in 1 week and continue to contact [place] until has results as she will be traveling. She has requested that we send her results through mychart, even if abnormal.  Shave biopsy Informed consent obtained after explanation of risks/benefits and other options. Time out. Area cleaned with betadine. Local anesthesia with lidocaine 1% with epi. Shave biopsy of lesion, attempted to remove all however small amount of pig remained upper lat edge. Hemostasis with drysol. Tolerated well. Specimen sent for path. Wound care instructions provided.   -Patient advised to return or notify a doctor immediately if symptoms worsen or persist or new concerns arise.  Patient Instructions  We have ordered a biopsy at this visit. It can take up to 1-2 weeks for results and processing. IF results require follow up or explanation, we will  call you with instructions. Clinically normal results will be released to your [facility]. You have also requested that we contact you via [place] instead of via phone even if the results are abnormal.   If you have not heard from [place] or cannot find your results in [facility] in 1-2 weeks please contact our office at [phone].  If you are not yet signed up for [facility], please consider signing up.           Colin Benton R., DO

## 2016-09-18 NOTE — Patient Instructions (Signed)
We have ordered a biopsy at this visit. It can take up to 1-2 weeks for results and processing. IF results require follow up or explanation, we will call you with instructions. Clinically normal results will be released to your [facility]. You have also requested that we contact you via [place] instead of via phone even if the results are abnormal.   If you have not heard from [place] or cannot find your results in [facility] in 1-2 weeks please contact our office at [phone].  If you are not yet signed up for [facility], please consider signing up.

## 2016-09-22 ENCOUNTER — Encounter: Payer: Self-pay | Admitting: Family Medicine

## 2016-09-22 DIAGNOSIS — D239 Other benign neoplasm of skin, unspecified: Secondary | ICD-10-CM

## 2016-09-22 HISTORY — DX: Other benign neoplasm of skin, unspecified: D23.9

## 2017-01-21 ENCOUNTER — Encounter: Payer: Self-pay | Admitting: Family Medicine

## 2017-04-05 NOTE — Progress Notes (Signed)
Michele Gilbert received her flu shot on 04/02/17 at the [facility] to RT deltoid. Lot # [id number] NDC: [phone] Mfg: [employer] Expires: 10/31/17

## 2017-05-13 ENCOUNTER — Encounter: Payer: Self-pay | Admitting: Family Medicine

## 2018-01-18 DIAGNOSIS — K5904 Chronic idiopathic constipation: Secondary | ICD-10-CM | POA: Diagnosis not present

## 2018-01-18 DIAGNOSIS — H538 Other visual disturbances: Secondary | ICD-10-CM | POA: Diagnosis not present

## 2018-01-18 DIAGNOSIS — H43393 Other vitreous opacities, bilateral: Secondary | ICD-10-CM | POA: Diagnosis not present

## 2018-01-19 DIAGNOSIS — H43813 Vitreous degeneration, bilateral: Secondary | ICD-10-CM | POA: Diagnosis not present

## 2018-03-17 DIAGNOSIS — J0100 Acute maxillary sinusitis, unspecified: Secondary | ICD-10-CM | POA: Diagnosis not present

## 2018-04-29 DIAGNOSIS — B9789 Other viral agents as the cause of diseases classified elsewhere: Secondary | ICD-10-CM | POA: Diagnosis not present

## 2018-04-29 DIAGNOSIS — J069 Acute upper respiratory infection, unspecified: Secondary | ICD-10-CM | POA: Diagnosis not present

## 2018-09-21 ENCOUNTER — Ambulatory Visit (INDEPENDENT_AMBULATORY_CARE_PROVIDER_SITE_OTHER): Payer: Medicaid Other | Admitting: Family Medicine

## 2018-09-21 ENCOUNTER — Other Ambulatory Visit: Payer: Self-pay

## 2018-09-21 ENCOUNTER — Encounter: Payer: Self-pay | Admitting: Family Medicine

## 2018-09-21 DIAGNOSIS — Z131 Encounter for screening for diabetes mellitus: Secondary | ICD-10-CM | POA: Diagnosis not present

## 2018-09-21 DIAGNOSIS — L659 Nonscarring hair loss, unspecified: Secondary | ICD-10-CM | POA: Diagnosis not present

## 2018-09-21 DIAGNOSIS — R5383 Other fatigue: Secondary | ICD-10-CM | POA: Diagnosis not present

## 2018-09-21 DIAGNOSIS — Z13220 Encounter for screening for lipoid disorders: Secondary | ICD-10-CM

## 2018-09-21 DIAGNOSIS — Z862 Personal history of diseases of the blood and blood-forming organs and certain disorders involving the immune mechanism: Secondary | ICD-10-CM

## 2018-09-21 DIAGNOSIS — I8393 Asymptomatic varicose veins of bilateral lower extremities: Secondary | ICD-10-CM | POA: Diagnosis not present

## 2018-09-21 DIAGNOSIS — R4589 Other symptoms and signs involving emotional state: Secondary | ICD-10-CM

## 2018-09-21 NOTE — Progress Notes (Signed)
Virtual Visit via Video Note  I connected with Michele Gilbert   on 09/21/18 at  3:15 PM EDT by a video enabled telemedicine application and verified that I am speaking with the correct person using two identifiers.  Location patient: home Location provider:work office Persons participating in the virtual visit: patient, provider  I discussed the limitations of evaluation and management by telemedicine and the availability of in person appointments. The patient expressed understanding and agreed to proceed.   Michele Gilbert DOB: [birth year] Encounter date: 09/21/2018  This is a 38 y.o. female who presents to establish care. Chief Complaint  Patient presents with  . Hair/Scalp Problem    patient complains of hair loss in patches x6 months    History of present illness: 5 days before period and 5 days after she feels like she is crazy, sensitive. Also started to notice that she is losing hair. Feels like hairline is retracting and she can see areas of thinning. Feels like she is shedding a lot of hair. Has always had thicker hair in back and less on top; but feels like whole top of head is losing hair.   Feels like she has been more hormonal last year. One day she feels fine, happy and then next day she is irritable, snaps easily.   Has been home with kids more ages 3, 56. Has had some issues with mood since moving to [place] 12 years ago. Has MIL to help her with kids, but doesn't want to turn to her.   Hormonal swings in last few years. Periods are pretty normal. Last for 4 days q 25-28 days. Haven't changed much.   Energy level is very low. Couldn't tolerate iron supplements because it made her very constipated.   Weight has been stable.   Still has issues with constipation. Takes dulcolax BID in order to go.   Migraines: still gets once in awhile. Takes ibuprofen if needed. This does usually work for her.   Hx of ADHD: not sure that this bothers her anymore. Doesn't think this is  current issue.   Not sleeping well - wakes to go to the bathroom and sometimes can't fall asleep for 3 hours.    Past Medical History:  Diagnosis Date  . Constipation   . Dysplastic nevus 09/22/2016   - R upper lateral arm (deltoid) -S/p biopsy 09/2016 - moderate atypia w/ recs from pathology for conservative re-excision if recurrent  pigmentation at site  . Headache(784.0)    frequent  . Mastitis   . Migraine   . UTI (urinary tract infection)    Past Surgical History:  Procedure Laterality Date  . NO PAST SURGERIES     Allergies  Allergen Reactions  . Latex Dermatitis   Current Meds  Medication Sig  . Bisacodyl (DULCOLAX PO) Take by mouth as needed.  Marland Kitchen MAGNESIUM PO Take by mouth daily.  . [DISCONTINUED] CAMILA 0.35 MG tablet    Social History   Tobacco Use  . Smoking status: Never Smoker  . Smokeless tobacco: Never Used  Substance Use Topics  . Alcohol use: Yes    Alcohol/week: 1.0 standard drinks    Types: 1 Glasses of wine per week    Comment: occasional   Family History  Problem Relation Age of Onset  . Hyperlipidemia Father   . Vision loss Mother   . Colon cancer Other        grandparent  . Diabetes Other   . Cancer Maternal Grandmother  stomach  . Diabetes Paternal Grandfather   . Cancer Maternal Uncle        stomach; mets     Review of Systems  Constitutional: Negative for chills, fatigue and fever.  Respiratory: Negative for cough, chest tightness, shortness of breath and wheezing.   Cardiovascular: Negative for chest pain, palpitations and leg swelling.  Endocrine: Positive for heat intolerance, polydipsia and polyuria. Negative for cold intolerance.  Neurological: Positive for headaches.  Psychiatric/Behavioral: Positive for sleep disturbance. The patient is nervous/anxious.     Objective:  There were no vitals taken for this visit.      BP Readings from Last 3 Encounters:  09/18/16 90/60  09/17/16 (!) 82/60  08/13/16 98/70   Wt  Readings from Last 3 Encounters:  09/18/16 137 lb 14.4 oz (62.6 kg)  09/17/16 131 lb 11.2 oz (59.7 kg)  08/13/16 130 lb 3.2 oz (59.1 kg)    EXAM:  GENERAL: alert, oriented, appears well and in no acute distress  HEENT: atraumatic, conjunctiva clear, no obvious abnormalities on inspection of external nose and ears  NECK: normal movements of the head and neck  LUNGS: on inspection no signs of respiratory distress, breathing rate appears normal, no obvious gross SOB, gasping or wheezing  CV: no obvious cyanosis  MS: moves all visible extremities without noticeable abnormality  PSYCH/NEURO: pleasant and cooperative, no obvious depression or anxiety, speech and thought processing grossly intact  SKIN: no obvious abnormality. Scalp part is somewhat prominent. No obvious patches of thin hair noted.  Assessment/Plan  1. Fatigue, unspecified type We will start with blood work.  Further evaluation and discussion pending these results. - Comprehensive metabolic panel; Future - TSH; Future - Vitamin B12; Future - VITAMIN D 25 Hydroxy (Vit-D Deficiency, Fractures); Future - IBC + Ferritin; Future  2. Varicose veins of both lower extremities, unspecified whether complicated States she has had these for some time.  Will further evaluate when able to do in office physical exam.  3. Hair loss Starting with blood work. - TSH; Future  4. Moodiness We will start with blood work, but discussed mood further pending these results.  5. Lipid screening  - Lipid panel; Future  6. Screening for diabetes mellitus  - Hemoglobin A1c; Future  7. History of anemia  - CBC with Differential/Platelet; Future - IBC + Ferritin; Future    Return pending bloodwork results.   I discussed the assessment and treatment plan with the patient. The patient was provided an opportunity to ask questions and all were answered. The patient agreed with the plan and demonstrated an understanding of the  instructions.   The patient was advised to call back or seek an in-person evaluation if the symptoms worsen or if the condition fails to improve as anticipated.  I provided 30 minutes of non-face-to-face time during this encounter.   [name], MD

## 2018-09-22 ENCOUNTER — Telehealth: Payer: Self-pay | Admitting: *Deleted

## 2018-09-22 NOTE — Telephone Encounter (Signed)
-----   Message from Caren Macadam, MD sent at 09/21/2018  3:47 PM EDT ----- Please schedule lab visit when able

## 2018-09-22 NOTE — Telephone Encounter (Signed)
I called the pt and scheduled a lab appt for tomorrow at 8am.

## 2018-09-23 ENCOUNTER — Other Ambulatory Visit: Payer: Self-pay

## 2018-09-23 ENCOUNTER — Other Ambulatory Visit (INDEPENDENT_AMBULATORY_CARE_PROVIDER_SITE_OTHER): Payer: Managed Care, Other (non HMO)

## 2018-09-23 DIAGNOSIS — Z13220 Encounter for screening for lipoid disorders: Secondary | ICD-10-CM | POA: Diagnosis not present

## 2018-09-23 DIAGNOSIS — L659 Nonscarring hair loss, unspecified: Secondary | ICD-10-CM | POA: Diagnosis not present

## 2018-09-23 DIAGNOSIS — Z862 Personal history of diseases of the blood and blood-forming organs and certain disorders involving the immune mechanism: Secondary | ICD-10-CM

## 2018-09-23 DIAGNOSIS — Z131 Encounter for screening for diabetes mellitus: Secondary | ICD-10-CM

## 2018-09-23 DIAGNOSIS — R5383 Other fatigue: Secondary | ICD-10-CM

## 2018-09-23 LAB — COMPREHENSIVE METABOLIC PANEL
ALT: 11 U/L (ref 0–35)
AST: 14 U/L (ref 0–37)
Albumin: 4.5 g/dL (ref 3.5–5.2)
Alkaline Phosphatase: 81 U/L (ref 39–117)
BUN: 15 mg/dL (ref 6–23)
CO2: 28 mEq/L (ref 19–32)
Calcium: 9.7 mg/dL (ref 8.4–10.5)
Chloride: 103 mEq/L (ref 96–112)
Creatinine, Ser: 0.7 mg/dL (ref 0.40–1.20)
GFR: 93.29 mL/min (ref >60.00)
Glucose, Bld: 82 mg/dL (ref 70–99)
Potassium: 4.1 mEq/L (ref 3.5–5.1)
Sodium: 140 mEq/L (ref 135–145)
Total Bilirubin: 0.3 mg/dL (ref 0.2–1.2)
Total Protein: 7.7 g/dL (ref 6.0–8.3)

## 2018-09-23 LAB — IBC + FERRITIN
Ferritin: 10.7 ng/mL (ref 10.0–291.0)
Iron: 45 ug/dL (ref 42–145)
Saturation Ratios: 10.9 % — ABNORMAL LOW (ref 20.0–50.0)
Transferrin: 296 mg/dL (ref 212.0–360.0)

## 2018-09-23 LAB — HEMOGLOBIN A1C: Hgb A1c MFr Bld: 5.5 % (ref 4.6–6.5)

## 2018-09-23 LAB — CBC WITH DIFFERENTIAL/PLATELET
Basophils Absolute: 0 10*3/uL (ref 0.0–0.1)
Basophils Relative: 0.4 % (ref 0.0–3.0)
Eosinophils Absolute: 0.2 10*3/uL (ref 0.0–0.7)
Eosinophils Relative: 2.6 % (ref 0.0–5.0)
HCT: 39 % (ref 36.0–46.0)
Hemoglobin: 13.2 g/dL (ref 12.0–15.0)
Lymphocytes Relative: 38 % (ref 12.0–46.0)
Lymphs Abs: 2.6 10*3/uL (ref 0.7–4.0)
MCHC: 33.9 g/dL (ref 30.0–36.0)
MCV: 84.7 fl (ref 78.0–100.0)
Monocytes Absolute: 0.6 10*3/uL (ref 0.1–1.0)
Monocytes Relative: 9.5 % (ref 3.0–12.0)
Neutro Abs: 3.3 10*3/uL (ref 1.4–7.7)
Neutrophils Relative %: 49.5 % (ref 43.0–77.0)
Platelets: 312 10*3/uL (ref 150.0–400.0)
RBC: 4.6 Mil/uL (ref 3.87–5.11)
RDW: 14.3 % (ref 11.5–15.5)
WBC: 6.7 10*3/uL (ref 4.0–10.5)

## 2018-09-23 LAB — LIPID PANEL
Cholesterol: 168 mg/dL (ref 0–200)
HDL: 33.6 mg/dL — ABNORMAL LOW (ref >39.00)
LDL Cholesterol: 99 mg/dL (ref 0–99)
NonHDL: 134.77
Total CHOL/HDL Ratio: 5
Triglycerides: 178 mg/dL — ABNORMAL HIGH (ref 0.0–149.0)
VLDL: 35.6 mg/dL (ref 0.0–40.0)

## 2018-09-23 LAB — VITAMIN B12: Vitamin B-12: 795 pg/mL (ref 211–911)

## 2018-09-23 LAB — VITAMIN D 25 HYDROXY (VIT D DEFICIENCY, FRACTURES): VITD: 21.66 ng/mL — ABNORMAL LOW (ref 30.00–100.00)

## 2018-09-23 LAB — TSH: TSH: 2.67 u[IU]/mL (ref 0.35–4.50)

## 2018-10-03 ENCOUNTER — Other Ambulatory Visit: Payer: Self-pay

## 2018-10-03 ENCOUNTER — Telehealth: Payer: Self-pay | Admitting: *Deleted

## 2018-10-03 ENCOUNTER — Telehealth: Payer: Self-pay | Admitting: Family Medicine

## 2018-10-03 ENCOUNTER — Ambulatory Visit: Payer: Managed Care, Other (non HMO) | Admitting: Family Medicine

## 2018-10-03 NOTE — Telephone Encounter (Signed)
I called the pt and she stated she went to a party, was on a patio, was around someone that tested positive for COVID 19 and questioned what she should do in regards to being around other people as she has a follow up here on Friday.  Stated her husband has an appt today for a virtual visit.  I advised the pt she could do the visit over the phone and this was changed.

## 2018-10-03 NOTE — Telephone Encounter (Signed)
Copied from [place] [id number]. Topic: Quick Communication - Appointment Cancellation >> Oct 03, 2018  7:26 AM Robina Ade, Helene Kelp D wrote: Patient called to cancel appointment scheduled for 10/03/18 due to close contact with covid patient. Patient HAS NOT rescheduled their appointment.  Route to department's PEC pool. Patient would like to reschedule and speak with her pcp nurse.

## 2018-10-03 NOTE — Telephone Encounter (Signed)
Called patient with no answer. LVM for patient to call back and reschedule her appointment.

## 2018-10-03 NOTE — Telephone Encounter (Signed)
Questions about Covid exposure- requesting a call back.

## 2018-10-07 ENCOUNTER — Ambulatory Visit (INDEPENDENT_AMBULATORY_CARE_PROVIDER_SITE_OTHER): Payer: Managed Care, Other (non HMO) | Admitting: Family Medicine

## 2018-10-07 ENCOUNTER — Other Ambulatory Visit: Payer: Self-pay

## 2018-10-07 ENCOUNTER — Encounter: Payer: Self-pay | Admitting: Family Medicine

## 2018-10-07 DIAGNOSIS — F3281 Premenstrual dysphoric disorder: Secondary | ICD-10-CM

## 2018-10-07 DIAGNOSIS — Z20828 Contact with and (suspected) exposure to other viral communicable diseases: Secondary | ICD-10-CM | POA: Diagnosis not present

## 2018-10-07 DIAGNOSIS — R4586 Emotional lability: Secondary | ICD-10-CM

## 2018-10-07 DIAGNOSIS — Z20822 Contact with and (suspected) exposure to covid-19: Secondary | ICD-10-CM

## 2018-10-07 MED ORDER — SERTRALINE HCL 50 MG PO TABS: 50.0000 mg | ORAL_TABLET | Freq: Every day | ORAL | 2 refills | Status: DC

## 2018-10-07 NOTE — Progress Notes (Signed)
Virtual Visit via Video Note  I connected with Michele Gilbert on 10/07/18 at  3:00 PM EDT by a video enabled telemedicine application and verified that I am speaking with the correct person using two identifiers.  Location patient: home Location provider:work or home office Persons participating in the virtual visit: patient, provider  I discussed the limitations of evaluation and management by telemedicine and the availability of in person appointments. The patient expressed understanding and agreed to proceed.   [name] DOB: January 08, 1981 Encounter date: 10/07/2018  This is a 38 y.o. female who presents with Chief Complaint  Patient presents with  . Anxiety    History of present illness: Mood swings are very related to menstrual cycle. 5 days before cycle and then full time of cycle and then a few days afterwards. Goes from being happy, feeling well to being very tearful, crying, irritable. Feels more negative overall.  Significantly affects her ability to function and to relate to family members.  Has always had some anxiety/worrier.    Has problems sleeping; mind wandering at night.   Right now in cycle she is in middle of cycle.   Husband has had a COVID exposure at work.  He is not symptomatic currently, but both of them had symptoms earlier in the year they wonder may have been coronavirus.  She would like antibody testing this is creating a significant source of anxiety for her. She has no current symptoms (he does not either).    Allergies  Allergen Reactions  . Latex Dermatitis   Current Meds  Medication Sig  . Bisacodyl (DULCOLAX PO) Take by mouth as needed.  Marland Kitchen VITAMIN D PO Take by mouth.    Review of Systems  Constitutional: Negative for chills, fatigue and fever.  Respiratory: Negative for cough, chest tightness, shortness of breath and wheezing.   Cardiovascular: Negative for chest pain, palpitations and leg swelling.  Gastrointestinal: Positive for  constipation. Negative for abdominal pain and diarrhea.  Psychiatric/Behavioral: Positive for agitation, decreased concentration and sleep disturbance. Negative for suicidal ideas. The patient is nervous/anxious.     Objective:  There were no vitals taken for this visit.      BP Readings from Last 3 Encounters:  09/18/16 90/60  09/17/16 (!) 82/60  08/13/16 98/70   Wt Readings from Last 3 Encounters:  09/18/16 137 lb 14.4 oz (62.6 kg)  09/17/16 131 lb 11.2 oz (59.7 kg)  08/13/16 130 lb 3.2 oz (59.1 kg)    EXAM:  GENERAL: alert, oriented, appears well and in no acute distress  HEENT: atraumatic, conjunctiva clear, no obvious abnormalities on inspection of external nose and ears  NECK: normal movements of the head and neck  LUNGS: on inspection no signs of respiratory distress, breathing rate appears normal, no obvious gross SOB, gasping or wheezing  CV: no obvious cyanosis  MS: moves all visible extremities without noticeable abnormality  PSYCH/NEURO: pleasant and cooperative, no obvious depression or anxiety, speech and thought processing grossly intact.  Stable, but during her episodes she is extremely irritable and has difficulty functioning due to severity of mood swings.  GAD 7 : Generalized Anxiety Score 10/07/2018  Nervous, Anxious, on Edge 1  Control/stop worrying 2  Worry too much - different things 2  Trouble relaxing 2  Restless 1  Easily annoyed or irritable 3  Afraid - awful might happen 2  Total GAD 7 Score 13  Anxiety Difficulty Somewhat difficult    Depression screen [facility] 2/9 09/21/2018 09/17/2016  Decreased Interest 2 0  Down, Depressed, Hopeless 2 0  PHQ - 2 Score 4 0  Altered sleeping 3 -  Tired, decreased energy 3 -  Change in appetite 0 -  Feeling bad or failure about yourself  0 -  Trouble concentrating 0 -  Moving slowly or fidgety/restless 0 -  Suicidal thoughts 0 -  PHQ-9 Score 10 -  Difficult doing work/chores Not difficult at all -       Assessment/Plan 1. 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  Symptoms are very much related to hormonal cycle.  We discussed options for treatment today including therapy, working on lifestyle factors that can affect mood (increasing exercise), and medications.  I do think that a low-dose of an SSRI would be most beneficial to help with her mood swings surrounding menses.  We discussed pros and cons of medication and addressed her concerns with starting this new medication. Discussed new medication(s) today with patient. Discussed potential side effects and patient verbalized understanding.  Zoloft was chosen since this will help with some anxiety and depression symptoms, can cause looser stools (which would be of benefit since she tends to be constipated) and does tend to help somewhat with sleep, which is an ongoing issue for her.   2. PMDD (premenstrual dysphoric disorder) see above  3. Exposure to Covid-19 Virus  - SAR CoV2 Serology (COVID 19)AB(IGG)IA; Future   Return in about 6 weeks (around 11/18/2018) for physical exam. (can schedule when we get covid results)  I discussed the assessment and treatment plan with the patient. The patient was provided an opportunity to ask questions and all were answered. The patient agreed with the plan and demonstrated an understanding of the instructions.   The patient was advised to call back or seek an in-person evaluation if the symptoms worsen or if the condition fails to improve as anticipated.  I provided 30 minutes of non-face-to-face time during this encounter.   [name], MD

## 2018-10-12 ENCOUNTER — Other Ambulatory Visit: Payer: Managed Care, Other (non HMO)

## 2018-10-13 ENCOUNTER — Other Ambulatory Visit: Payer: Self-pay

## 2018-10-13 ENCOUNTER — Other Ambulatory Visit (INDEPENDENT_AMBULATORY_CARE_PROVIDER_SITE_OTHER): Payer: Managed Care, Other (non HMO)

## 2018-10-13 DIAGNOSIS — Z20828 Contact with and (suspected) exposure to other viral communicable diseases: Secondary | ICD-10-CM

## 2018-10-13 DIAGNOSIS — Z20822 Contact with and (suspected) exposure to covid-19: Secondary | ICD-10-CM

## 2018-10-14 LAB — SAR COV2 SEROLOGY (COVID19)AB(IGG),IA: SARS CoV2 AB IGG: NEGATIVE

## 2018-11-22 ENCOUNTER — Other Ambulatory Visit: Payer: Self-pay

## 2018-11-22 ENCOUNTER — Telehealth: Payer: Self-pay | Admitting: *Deleted

## 2018-11-22 ENCOUNTER — Ambulatory Visit (INDEPENDENT_AMBULATORY_CARE_PROVIDER_SITE_OTHER): Payer: Managed Care, Other (non HMO) | Admitting: Family Medicine

## 2018-11-22 DIAGNOSIS — J029 Acute pharyngitis, unspecified: Secondary | ICD-10-CM

## 2018-11-22 DIAGNOSIS — R519 Headache, unspecified: Secondary | ICD-10-CM

## 2018-11-22 DIAGNOSIS — Z20828 Contact with and (suspected) exposure to other viral communicable diseases: Secondary | ICD-10-CM | POA: Diagnosis not present

## 2018-11-22 DIAGNOSIS — R51 Headache: Secondary | ICD-10-CM

## 2018-11-22 DIAGNOSIS — R0981 Nasal congestion: Secondary | ICD-10-CM | POA: Diagnosis not present

## 2018-11-22 DIAGNOSIS — Z20822 Contact with and (suspected) exposure to covid-19: Secondary | ICD-10-CM

## 2018-11-22 NOTE — Telephone Encounter (Signed)
-----   Message from [name], DO sent at 11/22/2018  1:45 PM EDT ----- Follow up next week with Dr. Maudie Mercury

## 2018-11-22 NOTE — Patient Instructions (Signed)
Follow up:  2-7 days    Self Isolation/Home Quarantine: -see the CDC site for information:   RunningShows.co.za.html   -STAY HOME except for to seek medical care -stay in your own room away from others in your house and use a separate bathroom if possible -Wash hands frequently, wear a mask if you leave your room and interact as little as possible with others -seek medical care immediately if worsening - call our office for a visit or call ahead if going elsewhere to an urgent care  -seek emergency care if very sick or severe symptoms - call 911 -isolate for at least 10 days from the onset of symptoms PLUS 3 days of no fever PLUS 3 days of improving symptoms    Novel Coronavirus Testing: I sent an order for coronavirus testing. No Appointment is needed.  Testing Sites:    [place] Location:                            [address] [place] ([place] [facility]) Hours:                                 8a-3:45p, M-F  [facility] Location:                           [address], [place], [place] [address]                                                              [place] ([place]) Hours:                                 8a-3:45p, M-F  [place] Location:                            [place] (across from [place]) Hours:                                 8a-3:45p, M-F  Positive test. These tests are not 100% perfect, but if you tested positive for COVID-19, this confirms that you have contracted the SARS-CoV-2 virus. STAY HOME to complete full Quarantine per CDC guidelines.  Negative test. These tests are not 100% perfect but if you tested negative for COVID-19, this indicates that you may not have contracted the SARS-CoV-2 virus. Follow your doctor's recommendations and the CDC guidelines.

## 2018-11-22 NOTE — Telephone Encounter (Signed)
Appt scheduled for 7/28.

## 2018-11-22 NOTE — Progress Notes (Signed)
Virtual Visit via Video Note  I connected with Michele Gilbert  on 11/22/18 at  1:20 PM EDT by a video enabled telemedicine application and verified that I am speaking with the correct person using two identifiers.  Location patient: home Location provider:work or home office Persons participating in the virtual visit: patient, provider  I discussed the limitations of evaluation and management by telemedicine and the availability of in person appointments. The patient expressed understanding and agreed to proceed.   HPI:  Acute sick visit: -started 2 days ago -sore throat, sneezing, sinus congestin, sinus pain, feeling tired, HA,  -denies: sob, NVD, loss of taste or smell, inability to eat or drink, inability to get bed -husband started having similar symptoms 2 days ago, husband was tested for Dwight today -husband has a coworker whom is out of work for an exposure -husband and his coworkers use masks   ROS: See pertinent positives and negatives per HPI.  Past Medical History:  Diagnosis Date  . Constipation   . Dysplastic nevus 09/22/2016   - R upper lateral arm (deltoid) -S/p biopsy 09/2016 - moderate atypia w/ recs from pathology for conservative re-excision if recurrent  pigmentation at site  . Headache(784.0)    frequent  . Mastitis   . Migraine   . UTI (urinary tract infection)     Past Surgical History:  Procedure Laterality Date  . NO PAST SURGERIES      Family History  Problem Relation Age of Onset  . Hyperlipidemia Father   . Vision loss Mother   . Colon cancer Other        grandparent  . Diabetes Other   . Cancer Maternal Grandmother        stomach  . Diabetes Paternal Grandfather   . Cancer Maternal Uncle        stomach; mets    SOCIAL HX: see hpi   Current Outpatient Medications:  .  Bisacodyl (DULCOLAX PO), Take by mouth as needed., Disp: , Rfl:  .  sertraline (ZOLOFT) 50 MG tablet, Take 1 tablet (50 mg total) by mouth daily., Disp: 30 tablet, Rfl: 2 .   VITAMIN D PO, Take by mouth., Disp: , Rfl:   EXAM:  VITALS per patient if applicable:denies fever  GENERAL: alert, oriented, appears well and in no acute distress  HEENT: atraumatic, conjunttiva clear, no obvious abnormalities on inspection of external nose and ears  NECK: normal movements of the head and neck  LUNGS: on inspection no signs of respiratory distress, breathing rate appears normal, no obvious gross SOB, gasping or wheezing  CV: no obvious cyanosis  MS: moves all visible extremities without noticeable abnormality  PSYCH/NEURO: pleasant and cooperative, no obvious depression or anxiety, speech and thought processing grossly intact  ASSESSMENT AND PLAN:  Discussed the following assessment and plan:  Sore throat Sinus congestion Nonintractable headache  -we discussed possible serious and likely etiologies, workup and treatment, treatment risks and return precautions -after this discussion, Michele Gilbert opted for COVID19 testing, symptomatic care, home/self isolation/quarantine, close follow up -of course, we advised Michele Gilbert  to return or notify a doctor immediately if symptoms worsen or persist or new concerns arise.   Follow up instructions: Advised assistant Wendie Simmer to help patient arrange the following: -follow up Tues or Thursday next week   [name], DO   Patient Instructions  Follow up:  2-7 days    Self Isolation/Home Quarantine: -see the CDC site for information:   [email or web address].za.html   [facility] HOME except  for to seek medical care -stay in your own room away from others in your house and use a separate bathroom if possible -Wash hands frequently, wear a mask if you leave your room and interact as little as possible with others -seek medical care immediately if worsening - call our office for a visit or call ahead if going elsewhere to an urgent care  -seek emergency care if very sick or severe  symptoms - call 911 -isolate for at least 10 days from the onset of symptoms PLUS 3 days of no fever PLUS 3 days of improving symptoms    Novel Coronavirus Testing: I sent an order for coronavirus testing. No Appointment is needed.  Testing Sites:    [facility] Location:                            [address] [place] (old [facility]) Hours:                                 8a-3:45p, M-F  [facility] Location:                           [address] [place], [place] [address]                                                              [place] ([place]) Hours:                                 8a-3:45p, M-F  [facility] Location:                            [place], [place] (across from [place]) Hours:                                 8a-3:45p, M-F  Positive test. These tests are not 100% perfect, but if you tested positive for COVID-19, this confirms that you have contracted the SARS-CoV-2 virus. STAY HOME to complete full Quarantine per CDC guidelines.  Negative test. These tests are not 100% perfect but if you tested negative for COVID-19, this indicates that you may not have contracted the SARS-CoV-2 virus. Follow your doctor's recommendations and the CDC guidelines.

## 2018-11-22 NOTE — Progress Notes (Signed)
co

## 2018-11-24 LAB — NOVEL CORONAVIRUS, NAA: SARS-CoV-2, NAA: NOT DETECTED

## 2018-11-29 ENCOUNTER — Other Ambulatory Visit: Payer: Self-pay

## 2018-11-29 ENCOUNTER — Encounter: Payer: Self-pay | Admitting: Family Medicine

## 2018-11-29 ENCOUNTER — Ambulatory Visit (INDEPENDENT_AMBULATORY_CARE_PROVIDER_SITE_OTHER): Payer: Managed Care, Other (non HMO) | Admitting: Family Medicine

## 2018-11-29 ENCOUNTER — Telehealth: Payer: Self-pay | Admitting: *Deleted

## 2018-11-29 DIAGNOSIS — J069 Acute upper respiratory infection, unspecified: Secondary | ICD-10-CM | POA: Diagnosis not present

## 2018-11-29 DIAGNOSIS — F411 Generalized anxiety disorder: Secondary | ICD-10-CM | POA: Diagnosis not present

## 2018-11-29 DIAGNOSIS — L659 Nonscarring hair loss, unspecified: Secondary | ICD-10-CM | POA: Diagnosis not present

## 2018-11-29 MED ORDER — ESCITALOPRAM OXALATE 5 MG PO TABS: 5.0000 mg | ORAL_TABLET | Freq: Every day | ORAL | 3 refills | Status: DC

## 2018-11-29 NOTE — Telephone Encounter (Signed)
Appt scheduled per request

## 2018-11-29 NOTE — Telephone Encounter (Signed)
-----   Message from [name], DO sent at 11/29/2018 10:43 AM EDT ----- -follow up in 1 month

## 2018-11-29 NOTE — Telephone Encounter (Signed)
Pt called to return call. Please advise.  

## 2018-11-29 NOTE — Patient Instructions (Signed)
Stop the zoloft.  Start the lexapro.  Follow up in 1 months, sooner as needed.  Schedule Counseling with Dennison Bulla, [facility].

## 2018-11-29 NOTE — Telephone Encounter (Signed)
I called the pt and attempted to schedule an appt for 8/27 at 6:20pm and could not do so due to blocks on the schedule.  Message sent to [facility] to schedule the pt.

## 2018-11-29 NOTE — Progress Notes (Signed)
Virtual Visit via Video Note  I connected with Michele Gilbert  on 11/29/18 at 10:20 AM EDT by a video enabled telemedicine application and verified that I am speaking with the correct person using two identifiers.  Location patient: home Location provider:work or home office Persons participating in the virtual visit: patient, provider  I discussed the limitations of evaluation and management by telemedicine and the availability of in person appointments. The patient expressed understanding and agreed to proceed.   HPI:  Follow up resp illness: -started 7/19 -? COVID exposure -COVID test done 7/21 was neg -doing much better and symptoms resolved  She also wants to discuss anxiety: -constant anxiety and worry -she was started on zoloft and when takes the full pill makes her not able to sleep -can sleep on half pill, but doesn't help the anxiety -hates taking medications  -no severe symptoms, SI  Hair loss: -this has been going on for 1 year, but seems to be getting worse, feels is thinning on the sides and forehead and top -she wants a referral to a specialist -had extensive labs with visit in May with my colleague   ROS: See pertinent positives and negatives per HPI.  Past Medical History:  Diagnosis Date  . Constipation   . Dysplastic nevus 09/22/2016   - R upper lateral arm (deltoid) -S/p biopsy 09/2016 - moderate atypia w/ recs from pathology for conservative re-excision if recurrent  pigmentation at site  . Headache(784.0)    frequent  . Mastitis   . Migraine   . UTI (urinary tract infection)     Past Surgical History:  Procedure Laterality Date  . NO PAST SURGERIES      Family History  Problem Relation Age of Onset  . Hyperlipidemia Father   . Vision loss Mother   . Colon cancer Other        grandparent  . Diabetes Other   . Cancer Maternal Grandmother        stomach  . Diabetes Paternal Grandfather   . Cancer Maternal Uncle        stomach; mets    SOCIAL  HX: see hpi   Current Outpatient Medications:  .  Bisacodyl (DULCOLAX PO), Take by mouth as needed., Disp: , Rfl:  .  sertraline (ZOLOFT) 50 MG tablet, Take 1 tablet (50 mg total) by mouth daily., Disp: 30 tablet, Rfl: 2 .  VITAMIN D PO, Take by mouth., Disp: , Rfl:  .  escitalopram (LEXAPRO) 5 MG tablet, Take 1 tablet (5 mg total) by mouth daily., Disp: 30 tablet, Rfl: 3  EXAM:  VITALS per patient if applicable:  GENERAL: alert, oriented, appears well and in no acute distress  HEENT: atraumatic, conjunttiva clear, no obvious abnormalities on inspection of external nose and ears  NECK: normal movements of the head and neck  LUNGS: on inspection no signs of respiratory distress, breathing rate appears normal, no obvious gross SOB, gasping or wheezing  CV: no obvious cyanosis  MS: moves all visible extremities without noticeable abnormality  SKIN: appears to have some frontal and biparietal hair thinning  PSYCH/NEURO: pleasant and cooperative, no obvious depression or anxiety, speech and thought processing grossly intact  ASSESSMENT AND PLAN:  Discussed the following assessment and plan:  Hair loss  -discussed potential etiologies, she wants referral to specialist. Will refer to [facility] to see Dr. Kemper Durie if available. Referral placed. Advised 5 servings of iron rich foods daily and Vit D3 per recent labs - reviewed. Also advised daily hair  count test.  GAD (generalized anxiety disorder) - Plan:  -discussed options -she opted for trying to start CBT, info provided for her to call to schedule -stop zoloft and try low dose lexapro -follow up 1 month  Upper respiratory tract infection, unspecified type -  -resolved -completed 10 day quarantine today   I discussed the assessment and treatment plan with the patient. The patient was provided an opportunity to ask questions and all were answered. The patient agreed with the plan and demonstrated an understanding of the  instructions.   The patient was advised to call back or seek an in-person evaluation if the symptoms worsen or if the condition fails to improve as anticipated.   Follow up instructions: Advised assistant Wendie Simmer to help patient arrange the following: -follow up in 1 month  Michele Kern, DO

## 2018-12-29 ENCOUNTER — Telehealth (INDEPENDENT_AMBULATORY_CARE_PROVIDER_SITE_OTHER): Payer: Managed Care, Other (non HMO) | Admitting: Family Medicine

## 2018-12-29 ENCOUNTER — Other Ambulatory Visit: Payer: Self-pay

## 2018-12-29 DIAGNOSIS — L659 Nonscarring hair loss, unspecified: Secondary | ICD-10-CM | POA: Diagnosis not present

## 2018-12-29 DIAGNOSIS — F411 Generalized anxiety disorder: Secondary | ICD-10-CM

## 2018-12-29 DIAGNOSIS — Z713 Dietary counseling and surveillance: Secondary | ICD-10-CM | POA: Diagnosis not present

## 2018-12-29 MED ORDER — BUSPIRONE HCL 7.5 MG PO TABS: 7.5000 mg | ORAL_TABLET | Freq: Three times a day (TID) | ORAL | 1 refills | Status: DC

## 2018-12-29 NOTE — Patient Instructions (Signed)
Buspirone 7.5mg  twice daily for anxiety.  Please call [facility] today to schedule Cognitive Behavioral Therapy - [phone] or go to [email or web address]  Please let us know if you have not heard and update regarding the referral for the hair loss by next week.   We recommend the following healthy lifestyle: 1) Small portions. But, make sure to get regular (at least 3 per day), healthy meals and small healthy snacks if needed.  2) Eat a healthy clean diet.   TRY TO EAT: -at least 5-7 servings of low sugar, colorful, and nutrient rich vegetables per day (not corn, potatoes or bananas.) -berries are the best choice if you wish to eat fruit (only eat small amounts if trying to reduce weight)  -lean meets (fish, white meat of chicken or [place]) -vegan proteins for some meals - beans or tofu, whole grains, nuts and seeds -Replace bad fats with good fats - good fats include: fish, nuts and seeds, canola oil, olive oil -small amounts of low fat or non fat dairy -small amounts of100 % whole grains - check the lables -drink plenty of water  AVOID: -SUGAR, sweets, anything with added sugar, corn syrup or sweeteners - must read labels as even foods advertised as "healthy" often are loaded with sugar -if you must have a sweetener, small amounts of stevia may be best -sweetened beverages and artificially sweetened beverages -simple starches (rice, bread, potatoes, pasta, chips, etc - small amounts of 100% whole grains are ok) -red meat, pork, butter -fried foods, fast food, processed food, excessive dairy, eggs and coconut.  3)Get at least 150 minutes of sweaty aerobic exercise per week.  4)Reduce stress - consider counseling, meditation and relaxation to balance other aspects of your life.

## 2018-12-29 NOTE — Progress Notes (Signed)
Virtual Visit via Video Note  I connected with Michele Gilbert  on 12/29/18 at  6:20 PM EDT by a video enabled telemedicine application and verified that I am speaking with the correct person using two identifiers.  Location patient: home Location provider:work or home office Persons participating in the virtual visit: patient, provider  I discussed the limitations of evaluation and management by telemedicine and the availability of in person appointments. The patient expressed understanding and agreed to proceed.   HPI:  Follow up several issues and a new concern:  Stress/GAD: -she has tried several medications including zoloft which negatively impacted her sleep and now lexapro which she reports did nothing for her symptoms at the 5 and 10mg  doses and severely impacted her sleep at the 10mg  dose. -she has not done CBT yet, she prefers to see a female -she feels anxious most days, sometimes a good day, then a bad day - worries a lot -no depression, SI, thoughts of harm -helping her child with virtual learning has been stressful recently  Hair loss: -she did a hair count and is losing between 60-120 hairs per day -[facility], but the appointment they gave her is in 1 year, she is on a wait list to get in sooner and also was referred to [facility] -she has before and after photos and feels can see the difference  Weight gain: -feels has gained some weight with the pandemic and over the last year and wants to get this weight off -reports she had always been around 110, but now is around 140 -asks about various diets   Wt gain  ROS: See pertinent positives and negatives per HPI.  Past Medical History:  Diagnosis Date  . Constipation   . Dysplastic nevus 09/22/2016   - R upper lateral arm (deltoid) -S/p biopsy 09/2016 - moderate atypia w/ recs from pathology for conservative re-excision if recurrent  pigmentation at site  . Headache(784.0)    frequent  . Mastitis   .  Migraine   . UTI (urinary tract infection)     Past Surgical History:  Procedure Laterality Date  . NO PAST SURGERIES      Family History  Problem Relation Age of Onset  . Hyperlipidemia Father   . Vision loss Mother   . Colon cancer Other        grandparent  . Diabetes Other   . Cancer Maternal Grandmother        stomach  . Diabetes Paternal Grandfather   . Cancer Maternal Uncle        stomach; mets    SOCIAL HX: see hpi   Current Outpatient Medications:  .  Bisacodyl (DULCOLAX PO), Take by mouth as needed., Disp: , Rfl:  .  busPIRone (BUSPAR) 7.5 MG tablet, Take 1 tablet (7.5 mg total) by mouth 3 (three) times daily., Disp: 60 tablet, Rfl: 1 .  VITAMIN D PO, Take by mouth., Disp: , Rfl:   EXAM:  VITALS per patient if applicable:  GENERAL: alert, oriented, appears well and in no acute distress  HEENT: atraumatic, conjunttiva clear, no obvious abnormalities on inspection of external nose and ears, question of thinning hair on frontal portion of head, though still appears fairly thick.  NECK: normal movements of the head and neck  LUNGS: on inspection no signs of respiratory distress, breathing rate appears normal, no obvious gross SOB, gasping or wheezing  CV: no obvious cyanosis  MS: moves all visible extremities without noticeable abnormality  PSYCH/NEURO: pleasant and cooperative, no obvious depression or anxiety, speech and thought processing grossly intact  ASSESSMENT AND PLAN:  Discussed the following assessment and plan:  GAD (generalized anxiety disorder) -discussed varous treatment options/risks and she wants to try a different class of medication and agrees to call [facility] for CBT -opted to try buspar and started at 7.5mg  bid -follow up 1 month, sooner if any concerns (sent message to admin to assist in scheduling)  Hair loss -she has had a number of labs and periods normal -sent referral to [facility], but apparently there is a year long wait and she is  worried and trouble sig with this, looks like referral was resent locally when she called to let us know a few days ago - advised if she has not heard about this appt by next week to call us back to check again  Dietary counseling -wt 03/2018 per notes was 140, she felt wt had changed sig, looks like was 130s on prior visits here before that -discussed various diets and suggested the Mediterranean or keto diet   Buspirone Hair referral in [place] Keto or med diet Follow up 1 month CBT   I discussed the assessment and treatment plan with the patient. The patient was provided an opportunity to ask questions and all were answered. The patient agreed with the plan and demonstrated an understanding of the instructions.   The patient was advised to call back or seek an in-person evaluation if the symptoms worsen or if the condition fails to improve as anticipated.   [name], DO   Patient Instructions  Buspirone 7.5mg  twice daily for anxiety.  Please call [facility] today to schedule Cognitive Behavioral Therapy - [phone] or go to [email or web address]  Please let us know if you have not heard and update regarding the referral for the hair loss by next week.   We recommend the following healthy lifestyle: 1) Small portions. But, make sure to get regular (at least 3 per day), healthy meals and small healthy snacks if needed.  2) Eat a healthy clean diet.   TRY TO EAT: -at least 5-7 servings of low sugar, colorful, and nutrient rich vegetables per day (not corn, potatoes or bananas.) -berries are the best choice if you wish to eat fruit (only eat small amounts if trying to reduce weight)  -lean meets (fish, white meat of chicken or [place]) -vegan proteins for some meals - beans or tofu, whole grains, nuts and seeds -Replace bad fats with good fats - good fats include: fish, nuts and seeds, canola oil, olive oil -small amounts of low  fat or non fat dairy -small amounts of100 % whole grains - check the lables -drink plenty of water  AVOID: -SUGAR, sweets, anything with added sugar, corn syrup or sweeteners - must read labels as even foods advertised as "healthy" often are loaded with sugar -if you must have a sweetener, small amounts of stevia may be best -sweetened beverages and artificially sweetened beverages -simple starches (rice, bread, potatoes, pasta, chips, etc - small amounts of 100% whole grains are ok) -red meat, pork, butter -fried foods, fast food, processed food, excessive dairy, eggs and coconut.  3)Get at least 150 minutes of sweaty aerobic exercise per week.  4)Reduce stress - consider counseling, meditation and relaxation to balance other aspects of your life.

## 2018-12-30 ENCOUNTER — Telehealth: Payer: Self-pay | Admitting: Family Medicine

## 2018-12-30 NOTE — Telephone Encounter (Signed)
Made appt for a 1 mth f/u with Dr Kim//tes

## 2019-01-31 ENCOUNTER — Other Ambulatory Visit: Payer: Self-pay

## 2019-01-31 ENCOUNTER — Telehealth (INDEPENDENT_AMBULATORY_CARE_PROVIDER_SITE_OTHER): Payer: Managed Care, Other (non HMO) | Admitting: Family Medicine

## 2019-01-31 DIAGNOSIS — Z713 Dietary counseling and surveillance: Secondary | ICD-10-CM

## 2019-01-31 DIAGNOSIS — L659 Nonscarring hair loss, unspecified: Secondary | ICD-10-CM

## 2019-01-31 DIAGNOSIS — F419 Anxiety disorder, unspecified: Secondary | ICD-10-CM | POA: Diagnosis not present

## 2019-01-31 MED ORDER — BUSPIRONE HCL 10 MG PO TABS: 10.0000 mg | ORAL_TABLET | Freq: Two times a day (BID) | ORAL | 3 refills | Status: DC

## 2019-01-31 NOTE — Progress Notes (Signed)
Virtual Visit via Video Note  I connected with Michele Gilbert  on 01/31/19 at  6:20 PM EDT by a video enabled telemedicine application and verified that I am speaking with the correct person using two identifiers.  Location patient: home Location provider:work or home office Persons participating in the virtual visit: patient, provider  I discussed the limitations of evaluation and management by telemedicine and the availability of in person appointments. The patient expressed understanding and agreed to proceed.   HPI:  Reports is doing somewhat better - is sleeping better and is getting along better with her husband. She still has some daily anxiety. She is taking the buspar 7.5mg  bid. She has not done CBT as reports called [facility], but her call was not returned. She also has not been eating as much which she feels is a good thing. She started the keto diet, but wonders if getting enough carbs. She did see the dermatologist about her hair and is on doxy for this and feels may be gettng some new growth.    ROS: See pertinent positives and negatives per HPI.  Past Medical History:  Diagnosis Date  . Constipation   . Dysplastic nevus 09/22/2016   - R upper lateral arm (deltoid) -S/p biopsy 09/2016 - moderate atypia w/ recs from pathology for conservative re-excision if recurrent  pigmentation at site  . Headache(784.0)    frequent  . Mastitis   . Migraine   . UTI (urinary tract infection)     Past Surgical History:  Procedure Laterality Date  . NO PAST SURGERIES      Family History  Problem Relation Age of Onset  . Hyperlipidemia Father   . Vision loss Mother   . Colon cancer Other        grandparent  . Diabetes Other   . Cancer Maternal Grandmother        stomach  . Diabetes Paternal Grandfather   . Cancer Maternal Uncle        stomach; mets    SOCIAL HX: se ehpi   Current Outpatient Medications:  .  Bisacodyl (DULCOLAX PO), Take by mouth as needed., Disp: , Rfl:  .  busPIRone  (BUSPAR) 10 MG tablet, Take 1 tablet (10 mg total) by mouth 2 (two) times daily., Disp: 60 tablet, Rfl: 3 .  VITAMIN D PO, Take by mouth., Disp: , Rfl:   EXAM:  VITALS per patient if applicable:  GENERAL: alert, oriented, appears well and in no acute distress  HEENT: atraumatic, conjunttiva clear, no obvious abnormalities on inspection of external nose and ears  NECK: normal movements of the head and neck  LUNGS: on inspection no signs of respiratory distress, breathing rate appears normal, no obvious gross SOB, gasping or wheezing  CV: no obvious cyanosis  MS: moves all visible extremities without noticeable abnormality  PSYCH/NEURO: pleasant and cooperative, no obvious depression or anxiety, speech and thought processing grossly intact  ASSESSMENT AND PLAN:  Discussed the following assessment and plan:  Anxiety -glad sleep and some symptoms improved -still with daily anxiety and opted to increase buspar to  10mg  bid, she does not feel could do a tid dosing -also, advised calling again for CBT and to call every few days and to let me know if does not get a call back -follow up 1 month  Dietary counseling -discussed options for healthy diets, advised could add some whole grains as part of a healthy diet  Hair loss -seeing dermatology for management  Needs new in  house PCP, she prefers to see Dr. Ethlyn Gallery when available. Follow up w/ me virtual in interm.   I discussed the assessment and treatment plan with the patient. The patient was provided an opportunity to ask questions and all were answered. The patient agreed with the plan and demonstrated an understanding of the instructions.   The patient was advised to call back or seek an in-person evaluation if the symptoms worsen or if the condition fails to improve as anticipated.   [name], DO

## 2019-02-02 ENCOUNTER — Telehealth: Payer: Self-pay | Admitting: Family Medicine

## 2019-02-02 NOTE — Telephone Encounter (Signed)
LVM for pt to make a 1 mth f/u virtual appt with Dr Maudie Mercury around 03/02/19.

## 2019-02-20 ENCOUNTER — Other Ambulatory Visit: Payer: Self-pay | Admitting: Family Medicine

## 2019-02-21 NOTE — Telephone Encounter (Signed)
Dose should be 10mg  bid rather then 7.5 tid. She had said she did not wish to do 3x per day dosing. I updated medication list. But, please clarify these instructions with her.

## 2019-02-21 NOTE — Telephone Encounter (Signed)
Left a message for the pt to return my call.  CRM also created.  

## 2019-03-02 ENCOUNTER — Telehealth: Payer: Managed Care, Other (non HMO) | Admitting: Family Medicine

## 2019-03-05 ENCOUNTER — Other Ambulatory Visit: Payer: Self-pay | Admitting: Family Medicine

## 2019-03-14 ENCOUNTER — Other Ambulatory Visit: Payer: Self-pay | Admitting: Family Medicine

## 2019-03-14 NOTE — Telephone Encounter (Signed)
Left a message for the pt to return my call (see prior Rx request).

## 2019-03-21 ENCOUNTER — Other Ambulatory Visit: Payer: Self-pay

## 2019-03-21 ENCOUNTER — Telehealth (INDEPENDENT_AMBULATORY_CARE_PROVIDER_SITE_OTHER): Payer: Managed Care, Other (non HMO) | Admitting: Family Medicine

## 2019-03-21 DIAGNOSIS — F419 Anxiety disorder, unspecified: Secondary | ICD-10-CM

## 2019-03-21 DIAGNOSIS — R202 Paresthesia of skin: Secondary | ICD-10-CM | POA: Diagnosis not present

## 2019-03-21 DIAGNOSIS — L659 Nonscarring hair loss, unspecified: Secondary | ICD-10-CM

## 2019-03-21 NOTE — Patient Instructions (Signed)
-  schedule appointment with someone in office to ensure you have a primary care doctor and for the leg issues and lab follow up  -call your dermatologist about the hair and scalp issues  -continue the counseling for the anxiety, if it is worsening or you have any severe symptoms would advise seeing a psychiatrist to explore further medication and treatment options.

## 2019-03-21 NOTE — Progress Notes (Signed)
Virtual Visit via Video Note  I connected with Michele Gilbert  on 03/21/19 at  6:00 PM EST by a video enabled telemedicine application and verified that I am speaking with the correct person using two identifiers. She preferred to speak by phone so we did the visit that way instead.  Location patient: home Location provider:work or home office Persons participating in the virtual visit: patient, provider  I discussed the limitations of evaluation and management by telemedicine and the availability of in person appointments. The patient expressed understanding and agreed to proceed.   HPI:  Follow up anxiety: -current med: buspar 10mg  bid -advised CBT -she had sleep issues with zoloft and felt that lexapro did not work for her -reports: she decided to try to treat her anxiety without medication and stopped it 1 month ago, she now is seeing a Social worker at [facility] care counseling, she feels like the anxiety is worse some days - some days are ok -she does not want to take medications - prefers to do a more natural approach -denies depression, thoughts of harm, panic attacks or severe symptoms -she wants to see a doctor in the office for heavy tickling feeling intermittently in her arms and legs throughout, she also feels her hair loss as worsened and sees red spots on her scalp - she is seeing dermatologist for this and has a call in to their office for follow up. -she also wants to recheck her vit D and her iron stores Needs CPE w/ PCP. Saw Dr. Ethlyn Gallery in the past, wants her for PCP but not listed as PCP She is requesting an in office visit or CPE.  ROS: See pertinent positives and negatives per HPI.  Past Medical History:  Diagnosis Date  . Constipation   . Dysplastic nevus 09/22/2016   - R upper lateral arm (deltoid) -S/p biopsy 09/2016 - moderate atypia w/ recs from pathology for conservative re-excision if recurrent  pigmentation at site  . Headache(784.0)    frequent  . Mastitis   .  Migraine   . UTI (urinary tract infection)     Past Surgical History:  Procedure Laterality Date  . NO PAST SURGERIES      Family History  Problem Relation Age of Onset  . Hyperlipidemia Father   . Vision loss Mother   . Colon cancer Other        grandparent  . Diabetes Other   . Cancer Maternal Grandmother        stomach  . Diabetes Paternal Grandfather   . Cancer Maternal Uncle        stomach; mets    SOCIAL HX: see hpi   Current Outpatient Medications:  .  Bisacodyl (DULCOLAX PO), Take by mouth as needed., Disp: , Rfl:  .  busPIRone (BUSPAR) 10 MG tablet, Take 1 tablet (10 mg total) by mouth 2 (two) times daily., Disp: 60 tablet, Rfl: 3 .  VITAMIN D PO, Take by mouth., Disp: , Rfl:   EXAM:  VITALS per patient if applicable:  GENERAL: alert, oriented, no audible distress distress  PSYCH/NEURO: pleasant and cooperative, no obvious depression or anxiety, speech and thought processing grossly intact  ASSESSMENT AND PLAN:  Discussed the following assessment and plan:  Anxiety  Hair loss  Paresthesia  -we discussed possible serious and likely etiologies, options for evaluation and workup, limitations of telemedicine visit vs in person visit, treatment, treatment risks and precautions. Discussed options for further treatment of anxiety and she prefers to remain off medications.  Congratulated her on starting counseling. She is interested in complementary and alternative treatment methods and I suggested seeing and integrative or functional medicine specialist. She will consider. She is seeing dermatology for the hair loss and I advised she really needs to get back in with them since she now feels the hair loss is worse since starting treatment there  - she agrees and has called them and agrees to follow up. She needs and prefers an in person evaluation for the extremity complaint and will plan to do her lab follow up then - sent message to schedulers to assist. She has  seen Dr. Ethlyn Gallery and prefers to see her. Patient agrees to seek prompt in person care if worsening, new symptoms arise, or if is not improving with treatment.   I discussed the assessment and treatment plan with the patient. The patient was provided an opportunity to ask questions and all were answered. The patient agreed with the plan and demonstrated an understanding of the instructions.   The patient was advised to call back or seek an in-person evaluation if the symptoms worsen or if the condition fails to improve as anticipated.   [name], DO

## 2019-04-04 NOTE — Progress Notes (Signed)
Michele Gilbert DOB: [birth year] Encounter date: 04/05/2019  This is a 38 y.o. female who presents for complete physical, but due to some ongoing concerns, we are going to discuss concerns today of fatigue and will have her follow back up for physical.  History of present illness/Additional concerns: Last visit was with Dr. Maudie Mercury and anxiety was discussed as well as hair loss.   Main concern right now is fatigue. Always tired. Mom and dad have circulation problems - so wonders about this. Has tingling in arms, has to rest and then feels heaviness in hands. Pain from knee down - like tingling, achiness, heavy. Has had this for years, not sure if it is worse now, but just feels like she needs to have this addressed.   Still losing hair - went back to dermatologist. She recommended foam and a medication she is mixing to apply to scalp.   Has low iron levels - had constipation with supplement.   Periods are monthly - last 4-5 days with 2 heavy days.    ?gynecology: hasn't seen since she had daughter (who is five).  ?derm: did follow up.  They are treating her for hair loss currently  Has headaches all the time. Dad has migraine issues. Takes tylenol regularly. Not daily.  Feels like she has had headaches for years.  No recent change.  No trouble with breathing, no heart racing.  No cough.  Occasional joint pain.  Feels achy.  Feels in muscles and bones.  Not specifically in joints or getting joint swelling, but knees do bother her regularly.  Unless she takes dulcolax she won't go to the bathroom. At one point took metamucil but this didn't help. If she does take it she goes 6 hours later. Gets fullness lower abd without it.   Working on positive affirmation and working on doing yoga.  She does still have quite a bit of stress.  Her kids are both now at home with her and she is doing virtual schooling.  Past Medical History:  Diagnosis Date  . Constipation   . Dysplastic nevus 09/22/2016   -  R upper lateral arm (deltoid) -S/p biopsy 09/2016 - moderate atypia w/ recs from pathology for conservative re-excision if recurrent  pigmentation at site  . Headache(784.0)    frequent  . Mastitis   . Migraine   . UTI (urinary tract infection)    Past Surgical History:  Procedure Laterality Date  . NO PAST SURGERIES     Allergies  Allergen Reactions  . Latex Dermatitis   Current Meds  Medication Sig  . Bisacodyl (DULCOLAX PO) Take by mouth as needed.  . busPIRone (BUSPAR) 10 MG tablet Take 1 tablet (10 mg total) by mouth 2 (two) times daily.  Marland Kitchen VITAMIN D PO Take by mouth.   Social History   Tobacco Use  . Smoking status: Never Smoker  . Smokeless tobacco: Never Used  Substance Use Topics  . Alcohol use: Yes    Alcohol/week: 1.0 standard drinks    Types: 1 Glasses of wine per week    Comment: occasional   Family History  Problem Relation Age of Onset  . Hyperlipidemia Father   . Vision loss Mother   . Colon cancer Other        grandparent  . Diabetes Other   . Cancer Maternal Grandmother        stomach  . Diabetes Paternal Grandfather   . Cancer Maternal Uncle  stomach; mets     Review of Systems  Constitutional: Positive for fatigue. Negative for activity change, appetite change, chills, fever and unexpected weight change.  HENT: Negative for congestion, ear pain, hearing loss, sinus pressure, sinus pain, sore throat and trouble swallowing.   Eyes: Negative for pain and visual disturbance.       Has some floaters that started last year  Respiratory: Negative for cough, chest tightness, shortness of breath and wheezing.   Cardiovascular: Negative for chest pain, palpitations and leg swelling.  Gastrointestinal: Positive for constipation and nausea. Negative for abdominal pain, blood in stool, diarrhea and vomiting.  Genitourinary: Positive for dysuria. Negative for difficulty urinating and menstrual problem.  Musculoskeletal: Positive for myalgias.  Negative for arthralgias, back pain and joint swelling.  Skin: Negative for color change and rash.  Neurological: Positive for headaches. Negative for dizziness, weakness and numbness.  Hematological: Negative for adenopathy. Does not bruise/bleed easily.  Psychiatric/Behavioral: Negative for sleep disturbance and suicidal ideas. The patient is nervous/anxious.     CBC:  Lab Results  Component Value Date   WBC 6.7 09/23/2018   HGB 13.2 09/23/2018   HCT 39.0 09/23/2018   MCH 22.6 (L) 05/12/2014   MCHC 33.9 09/23/2018   RDW 14.3 09/23/2018   PLT 312.0 09/23/2018   CMP: Lab Results  Component Value Date   NA 140 09/23/2018   K 4.1 09/23/2018   CL 103 09/23/2018   CO2 28 09/23/2018   GLUCOSE 82 09/23/2018   BUN 15 09/23/2018   CREATININE 0.70 09/23/2018   CALCIUM 9.7 09/23/2018   PROT 7.7 09/23/2018   BILITOT 0.3 09/23/2018   ALKPHOS 81 09/23/2018   ALT 11 09/23/2018   AST 14 09/23/2018   LIPID: Lab Results  Component Value Date   CHOL 168 09/23/2018   TRIG 178.0 (H) 09/23/2018   HDL 33.60 (L) 09/23/2018   LDLCALC 99 09/23/2018    Objective:  BP 108/68 (BP Location: Left Arm)   Pulse 67   Temp (!) 97.5 F (36.4 C) (Temporal)   Ht 5\' 3"  (1.6 m)   Wt 138 lb 8 oz (62.8 kg)   SpO2 99%   BMI 24.53 kg/m   Weight: 138 lb 8 oz (62.8 kg)   BP Readings from Last 3 Encounters:  04/05/19 108/68  09/18/16 90/60  09/17/16 (!) 82/60   Wt Readings from Last 3 Encounters:  04/05/19 138 lb 8 oz (62.8 kg)  09/18/16 137 lb 14.4 oz (62.6 kg)  09/17/16 131 lb 11.2 oz (59.7 kg)    Physical Exam Constitutional:      General: She is not in acute distress.    Appearance: She is well-developed. She is not diaphoretic.  HENT:     Head: Normocephalic and atraumatic.     Right Ear: External ear normal.     Left Ear: External ear normal.  Eyes:     Conjunctiva/sclera: Conjunctivae normal.     Pupils: Pupils are equal, round, and reactive to light.  Neck:      Musculoskeletal: Neck supple.     Thyroid: No thyromegaly.  Cardiovascular:     Rate and Rhythm: Normal rate and regular rhythm.     Heart sounds: Normal heart sounds. No murmur. No friction rub. No gallop.   Pulmonary:     Effort: Pulmonary effort is normal. No respiratory distress.     Breath sounds: Normal breath sounds. No wheezing or rales.  Lymphadenopathy:     Cervical: No cervical adenopathy.  Skin:  General: Skin is warm and dry.  Neurological:     Mental Status: She is alert and oriented to person, place, and time.     Cranial Nerves: No cranial nerve deficit.     Motor: No abnormal muscle tone.     Deep Tendon Reflexes: Reflexes normal.     Reflex Scores:      Tricep reflexes are 2+ on the right side and 2+ on the left side.      Bicep reflexes are 2+ on the right side and 2+ on the left side.      Brachioradialis reflexes are 2+ on the right side and 2+ on the left side.      Patellar reflexes are 2+ on the right side and 2+ on the left side. Psychiatric:        Behavior: Behavior normal.     Assessment/Plan: There are no preventive care reminders to display for this patient. Health Maintenance reviewed. 1. Pain in both lower extremities This is difficult for her to describe, but seems to affect her daily function and bother her on a regular basis.  We discussed starting with some blood work.  She has had some blood work in the last 6 months notable only for an iron deficiency.  Pending blood work, will rediscuss symptoms.  We did also discuss that sometimes stress will have a very physical manifestation and this could also be playing a role. - Sedimentation rate; Future - C-reactive protein; Future - ANA; Future - ANA - C-reactive protein - Sedimentation rate  2. Other fatigue - CBC with Differential/Platelet; Future - Comprehensive metabolic panel; Future - ANA; Future - ANA - Comprehensive metabolic panel - CBC with Differential/Platelet  3. Iron  deficiency - IBC + Ferritin; Future - IBC + Ferritin  4. Vitamin D deficiency - VITAMIN D 25 Hydroxy (Vit-D Deficiency, Fractures); Future - VITAMIN D 25 Hydroxy (Vit-D Deficiency, Fractures)  5. Dysuria - Urinalysis; Future - Urinalysis  Return for pending labs.  [name], MD

## 2019-04-05 ENCOUNTER — Other Ambulatory Visit: Payer: Self-pay

## 2019-04-05 ENCOUNTER — Encounter: Payer: Self-pay | Admitting: Family Medicine

## 2019-04-05 ENCOUNTER — Ambulatory Visit (INDEPENDENT_AMBULATORY_CARE_PROVIDER_SITE_OTHER): Payer: Managed Care, Other (non HMO) | Admitting: Family Medicine

## 2019-04-05 VITALS — BP 108/68 | HR 67 | Temp 97.5°F | Ht 63.0 in | Wt 138.5 lb

## 2019-04-05 DIAGNOSIS — R300 Dysuria: Secondary | ICD-10-CM | POA: Diagnosis not present

## 2019-04-05 DIAGNOSIS — R5383 Other fatigue: Secondary | ICD-10-CM | POA: Diagnosis not present

## 2019-04-05 DIAGNOSIS — E611 Iron deficiency: Secondary | ICD-10-CM | POA: Diagnosis not present

## 2019-04-05 DIAGNOSIS — M79604 Pain in right leg: Secondary | ICD-10-CM | POA: Diagnosis not present

## 2019-04-05 DIAGNOSIS — E559 Vitamin D deficiency, unspecified: Secondary | ICD-10-CM | POA: Diagnosis not present

## 2019-04-05 DIAGNOSIS — M79605 Pain in left leg: Secondary | ICD-10-CM

## 2019-04-05 LAB — URINALYSIS
Bilirubin Urine: NEGATIVE
Hgb urine dipstick: NEGATIVE
Ketones, ur: NEGATIVE
Leukocytes,Ua: NEGATIVE
Nitrite: NEGATIVE
Specific Gravity, Urine: 1.02 (ref 1.000–1.030)
Total Protein, Urine: NEGATIVE
Urine Glucose: NEGATIVE
Urobilinogen, UA: 0.2 (ref 0.0–1.0)
pH: 6 (ref 5.0–8.0)

## 2019-04-05 LAB — IBC + FERRITIN
Ferritin: 8.6 ng/mL — ABNORMAL LOW (ref 10.0–291.0)
Iron: 54 ug/dL (ref 42–145)
Saturation Ratios: 13.5 % — ABNORMAL LOW (ref 20.0–50.0)
Transferrin: 286 mg/dL (ref 212.0–360.0)

## 2019-04-05 LAB — COMPREHENSIVE METABOLIC PANEL
ALT: 17 U/L (ref 0–35)
AST: 17 U/L (ref 0–37)
Albumin: 4.6 g/dL (ref 3.5–5.2)
Alkaline Phosphatase: 68 U/L (ref 39–117)
BUN: 16 mg/dL (ref 6–23)
CO2: 27 mEq/L (ref 19–32)
Calcium: 9.6 mg/dL (ref 8.4–10.5)
Chloride: 105 mEq/L (ref 96–112)
Creatinine, Ser: 0.6 mg/dL (ref 0.40–1.20)
GFR: 111.15 mL/min (ref >60.00)
Glucose, Bld: 84 mg/dL (ref 70–99)
Potassium: 4.6 mEq/L (ref 3.5–5.1)
Sodium: 140 mEq/L (ref 135–145)
Total Bilirubin: 0.2 mg/dL (ref 0.2–1.2)
Total Protein: 7.4 g/dL (ref 6.0–8.3)

## 2019-04-05 LAB — CBC WITH DIFFERENTIAL/PLATELET
Basophils Absolute: 0 10*3/uL (ref 0.0–0.1)
Basophils Relative: 0.5 % (ref 0.0–3.0)
Eosinophils Absolute: 0.1 10*3/uL (ref 0.0–0.7)
Eosinophils Relative: 1.9 % (ref 0.0–5.0)
HCT: 39 % (ref 36.0–46.0)
Hemoglobin: 12.9 g/dL (ref 12.0–15.0)
Lymphocytes Relative: 38.4 % (ref 12.0–46.0)
Lymphs Abs: 2.7 10*3/uL (ref 0.7–4.0)
MCHC: 33.2 g/dL (ref 30.0–36.0)
MCV: 86.1 fl (ref 78.0–100.0)
Monocytes Absolute: 0.6 10*3/uL (ref 0.1–1.0)
Monocytes Relative: 9 % (ref 3.0–12.0)
Neutro Abs: 3.6 10*3/uL (ref 1.4–7.7)
Neutrophils Relative %: 50.2 % (ref 43.0–77.0)
Platelets: 291 10*3/uL (ref 150.0–400.0)
RBC: 4.53 Mil/uL (ref 3.87–5.11)
RDW: 13.5 % (ref 11.5–15.5)
WBC: 7.1 10*3/uL (ref 4.0–10.5)

## 2019-04-05 LAB — C-REACTIVE PROTEIN: CRP: <1 mg/dL (ref 0.5–20.0)

## 2019-04-05 LAB — VITAMIN D 25 HYDROXY (VIT D DEFICIENCY, FRACTURES): VITD: 25.17 ng/mL — ABNORMAL LOW (ref 30.00–100.00)

## 2019-04-05 LAB — SEDIMENTATION RATE: Sed Rate: 15 mm/hr (ref 0–20)

## 2019-04-06 LAB — ANA: Anti Nuclear Antibody (ANA): NEGATIVE

## 2019-04-11 ENCOUNTER — Other Ambulatory Visit: Payer: Self-pay | Admitting: Family Medicine

## 2019-04-11 DIAGNOSIS — E611 Iron deficiency: Secondary | ICD-10-CM

## 2019-04-12 ENCOUNTER — Encounter: Payer: Self-pay | Admitting: Hematology and Oncology

## 2019-04-17 ENCOUNTER — Other Ambulatory Visit: Payer: Self-pay | Admitting: Family Medicine

## 2019-04-24 NOTE — Progress Notes (Signed)
[facility] CONSULT NOTE  Patient Care Team: Princess Bruins, MD as Attending Physician (Obstetrics and Gynecology)  CHIEF COMPLAINTS/PURPOSE OF CONSULTATION:  Newly diagnosed iron deficiency anemia  HISTORY OF PRESENTING ILLNESS:  Michele Gilbert 38 y.o. female is here because of recent diagnosis of iron deficiency anemia. She is referred by her PCP, Dr. Ethlyn Gallery. Labs on 04/05/19 showed Hg 12.9, HCT 39.0, MCV 86.1,  iron saturation 13.5%, ferritin 8.6, ANA negative, CRP <1.0. She presents to the clinic today for initial evaluation and to discuss treatment options.  Patient is originally from [place] and has had profound anemia all her life. Her current symptoms are related to pica with ice, fatigue, muscle aches and pains, hair loss, nail brittleness. She does have 4 days of heavy menstrual cycles which could be a cause of anemia but she has had iron deficiency even when she was 38 years old. Whenever she took oral iron she had severe constipation and she cannot tolerate it.  I reviewed her records extensively and collaborated the history with the patient.  MEDICAL HISTORY:  Past Medical History:  Diagnosis Date  . Constipation   . Dysplastic nevus 09/22/2016   - R upper lateral arm (deltoid) -S/p biopsy 09/2016 - moderate atypia w/ recs from pathology for conservative re-excision if recurrent  pigmentation at site  . Headache(784.0)    frequent  . Mastitis   . Migraine   . UTI (urinary tract infection)     SURGICAL HISTORY: Past Surgical History:  Procedure Laterality Date  . NO PAST SURGERIES      SOCIAL HISTORY: Social History   Socioeconomic History  . Marital status: Married    Spouse name: Not on file  . Number of children: Not on file  . Years of education: Not on file  . Highest education level: Not on file  Occupational History  . Not on file  Tobacco Use  . Smoking status: Never Smoker  . Smokeless tobacco: Never Used  Substance and  Sexual Activity  . Alcohol use: Yes    Alcohol/week: 1.0 standard drinks    Types: 1 Glasses of wine per week    Comment: occasional  . Drug use: No  . Sexual activity: Yes    Birth control/protection: None  Other Topics Concern  . Not on file  Social History Narrative   Work or School:      Home Situation: lives with husband and 2 children (3 and 57 yo in 2018)   From [place]      Spiritual Beliefs:      Lifestyle:   Social Determinants of Health   Financial Resource Strain:   . Difficulty of Paying Living Expenses: Not on file  Food Insecurity:   . Worried About Charity fundraiser in the Last Year: Not on file  . Ran Out of Food in the Last Year: Not on file  Transportation Needs:   . Lack of Transportation (Medical): Not on file  . Lack of Transportation (Non-Medical): Not on file  Physical Activity:   . Days of Exercise per Week: Not on file  . Minutes of Exercise per Session: Not on file  Stress:   . Feeling of Stress : Not on file  Social Connections:   . Frequency of Communication with Friends and Family: Not on file  . Frequency of Social Gatherings with Friends and Family: Not on file  . Attends Religious Services: Not on file  . Active Member of Clubs or Organizations:  Not on file  . Attends Archivist Meetings: Not on file  . Marital Status: Not on file  Intimate Partner Violence:   . Fear of Current or Ex-Partner: Not on file  . Emotionally Abused: Not on file  . Physically Abused: Not on file  . Sexually Abused: Not on file    FAMILY HISTORY: Family History  Problem Relation Age of Onset  . Hyperlipidemia Father   . Vision loss Mother   . Colon cancer Other        grandparent  . Diabetes Other   . Cancer Maternal Grandmother        stomach  . Diabetes Paternal Grandfather   . Cancer Maternal Uncle        stomach; mets    ALLERGIES:  is allergic to latex.  MEDICATIONS:  Current Outpatient Medications  Medication Sig Dispense  Refill  . Bisacodyl (DULCOLAX PO) Take by mouth as needed.    . busPIRone (BUSPAR) 10 MG tablet Take 1 tablet (10 mg total) by mouth 2 (two) times daily. 60 tablet 3  . VITAMIN D PO Take by mouth.     No current facility-administered medications for this visit.    REVIEW OF SYSTEMS:   Constitutional: Denies fevers, chills or abnormal night sweats Eyes: Denies blurriness of vision, double vision or watery eyes Ears, nose, mouth, throat, and face: Denies mucositis or sore throat Respiratory: Denies cough, dyspnea or wheezes Cardiovascular: Denies palpitation, chest discomfort or lower extremity swelling Gastrointestinal:  Denies nausea, heartburn or change in bowel habits Skin: Denies abnormal skin rashes Lymphatics: Denies new lymphadenopathy or easy bruising Neurological:Denies numbness, tingling or new weaknesses Behavioral/Psych: Mood is stable, no new changes  Breast: Denies any palpable lumps or discharge All other systems were reviewed with the patient and are negative.  PHYSICAL EXAMINATION: ECOG PERFORMANCE STATUS: 1 - Symptomatic but completely ambulatory  There were no vitals filed for this visit. There were no vitals filed for this visit.  GENERAL:alert, no distress and comfortable SKIN: skin color, texture, turgor are normal, no rashes or significant lesions EYES: normal, conjunctiva are pink and non-injected, sclera clear OROPHARYNX:no exudate, no erythema and lips, buccal mucosa, and tongue normal  NECK: supple, thyroid normal size, non-tender, without nodularity LYMPH:  no palpable lymphadenopathy in the cervical, axillary or inguinal LUNGS: clear to auscultation and percussion with normal breathing effort HEART: regular rate & rhythm and no murmurs and no lower extremity edema ABDOMEN:abdomen soft, non-tender and normal bowel sounds Musculoskeletal:no cyanosis of digits and no clubbing  PSYCH: alert & oriented x 3 with fluent speech NEURO: no focal motor/sensory  deficits  LABORATORY DATA:  I have reviewed the data as listed Lab Results  Component Value Date   WBC 7.1 04/05/2019   HGB 12.9 04/05/2019   HCT 39.0 04/05/2019   MCV 86.1 04/05/2019   PLT 291.0 04/05/2019   Lab Results  Component Value Date   NA 140 04/05/2019   K 4.6 04/05/2019   CL 105 04/05/2019   CO2 27 04/05/2019    RADIOGRAPHIC STUDIES: I have personally reviewed the radiological reports and agreed with the findings in the report.  ASSESSMENT AND PLAN:  Iron deficiency anemia Lab review 04/05/2019: Hemoglobin 12.9, MCV 86.1, ferritin 8.6, iron saturation 13.5 Patient was unable to tolerate oral iron. Patient complains of fatigue and muscle aches and pains. Counseling: I discussed with the patient the mechanisms of iron absorption and different causes of iron deficiency.  Blood loss versus malabsorption are  the primary differentials. Even though there is no clinical signs or symptoms of bleeding, I recommended GI consultation.  Based on her current symptoms: I recommended IV iron therapy.  Return to clinic in 3 months with labs and follow-up labs to be done ahead of time   All questions were answered. The patient knows to call the clinic with any problems, questions or concerns.   Rulon Eisenmenger, MD, MPH 04/25/2019    I, Molly Dorshimer, am acting as scribe for Nicholas Lose, MD.  I have reviewed the above documentation for accuracy and completeness, and I agree with the above.

## 2019-04-25 ENCOUNTER — Inpatient Hospital Stay: Payer: Managed Care, Other (non HMO) | Attending: Hematology and Oncology | Admitting: Hematology and Oncology

## 2019-04-25 ENCOUNTER — Other Ambulatory Visit: Payer: Self-pay

## 2019-04-25 DIAGNOSIS — Z79899 Other long term (current) drug therapy: Secondary | ICD-10-CM | POA: Diagnosis not present

## 2019-04-25 DIAGNOSIS — Z833 Family history of diabetes mellitus: Secondary | ICD-10-CM | POA: Diagnosis not present

## 2019-04-25 DIAGNOSIS — D509 Iron deficiency anemia, unspecified: Secondary | ICD-10-CM | POA: Diagnosis not present

## 2019-04-25 DIAGNOSIS — R5383 Other fatigue: Secondary | ICD-10-CM | POA: Insufficient documentation

## 2019-04-25 NOTE — Assessment & Plan Note (Signed)
Lab review 04/05/2019: Hemoglobin 12.9, MCV 86.1, ferritin 8.6, iron saturation 13.5 Patient was unable to tolerate oral iron. Patient complains of fatigue and muscle aches and pains. Counseling: I discussed with the patient the mechanisms of iron absorption and different causes of iron deficiency.  Blood loss versus malabsorption are the primary differentials. Even though there is no clinical signs or symptoms of bleeding, I recommended GI consultation.  Based on her current symptoms: I recommended IV iron therapy.  Return to clinic in 3 months with labs and follow-up labs to be done ahead of time

## 2019-04-26 ENCOUNTER — Telehealth: Payer: Self-pay | Admitting: Hematology and Oncology

## 2019-04-26 NOTE — Telephone Encounter (Signed)
I talk with patient regarding schedule  

## 2019-05-16 ENCOUNTER — Inpatient Hospital Stay: Payer: Managed Care, Other (non HMO) | Attending: Hematology and Oncology

## 2019-05-16 ENCOUNTER — Other Ambulatory Visit: Payer: Self-pay

## 2019-05-16 VITALS — BP 105/71 | HR 70 | Temp 98.4°F | Resp 18

## 2019-05-16 DIAGNOSIS — D509 Iron deficiency anemia, unspecified: Secondary | ICD-10-CM | POA: Diagnosis not present

## 2019-05-16 MED ORDER — ACETAMINOPHEN 325 MG PO TABS
ORAL_TABLET | ORAL | Status: AC
  Filled 2019-05-16: qty 2

## 2019-05-16 MED ORDER — SODIUM CHLORIDE 0.9 % IV SOLN
200.0000 mg | Freq: Once | INTRAVENOUS | Status: AC
  Administered 2019-05-16: 200 mg via INTRAVENOUS
  Filled 2019-05-16: qty 200

## 2019-05-16 MED ORDER — SODIUM CHLORIDE 0.9 % IV SOLN
Freq: Once | INTRAVENOUS | Status: AC
  Filled 2019-05-16: qty 250

## 2019-05-16 MED ORDER — ACETAMINOPHEN 325 MG PO TABS
650.0000 mg | ORAL_TABLET | Freq: Once | ORAL | Status: AC
  Administered 2019-05-16: 650 mg via ORAL

## 2019-05-16 NOTE — Patient Instructions (Signed)
Iron Sucrose injection Qu es este medicamento? [place] HIERRO es un complejo de hierro. El hierro se [place] para la produccin de glbulos rojos sanos, los cuales transportan el oxgeno y los nutrientes hacia todo el cuerpo. Este medicamento se [place] para tratar la anemia a causa de deficiencia de hierro en personas con enfermedad renal crnica. Este medicamento puede ser utilizado para otros usos; si tiene alguna pregunta consulte con su proveedor de atencin mdica o con su farmacutico. MARCAS COMUNES: Venofer Qu le debo informar a mi profesional de la salud antes de tomar este medicamento? Necesita saber si usted presenta alguno de los [employer] o situaciones:  anemia no provocada por niveles bajos de hierro  enfermedad cardiaca  niveles altos de hierro en la sangre  enfermedad renal  enfermedad heptica  una reaccin alrgica o inusual al hierro, otros medicamentos, alimentos, colorantes o conservantes  si est embarazada o buscando quedar embarazada  si est amamantando a un beb Cmo debo Insurance account manager medicamento? Este medicamento se administra mediante infusin por va intravenosa. Lo administra un profesional de Technical sales engineer en un hospital o en un entorno clnico. Hable con su pediatra para informarse acerca del uso de este medicamento en nios. Aunque este medicamento se puede recetar a nios tan pequeos como de 2 aos de edad en casos selectos, existen precauciones que deben tomarse. Sobredosis: Pngase en contacto inmediatamente con un centro toxicolgico o una sala de urgencia si usted cree que haya tomado demasiado medicamento. ATENCIN: [employer] es solo para usted. No comparta este medicamento con nadie. Qu sucede si me olvido de una dosis? Es importante no olvidar ninguna dosis. Informe a su mdico o a su profesional de la salud si no puede asistir a Photographer. Qu puede interactuar con este medicamento? No tome esta medicina con ninguno de  los siguientes medicamentos:  deferoxamina  dimercaprol  otros productos que Production manager hierro Esta medicina tambin puede Counselling psychologist con los siguientes medicamentos:  cloranfenicol  deferasirox Puede ser que esta lista no menciona todas las posibles interacciones. Informe a su profesional de [employer]	[place] de [employer] productos a base de hierbas, medicamentos de [employer] o suplementos nutritivos que est tomando. Si usted fuma, consume bebidas alcohlicas o si utiliza drogas ilegales, indqueselo tambin a su profesional de [employer]	[place]. Algunas sustancias pueden interactuar con su medicamento. A qu debo estar atento al usar [employer]? Visite a su mdico o a su profesional de la salud de [place] regular. Si los sntomas no comienzan a mejorar o si empeoran, consulte con su mdico o con su profesional de [employer]	[place]. Tal vez necesita realizarse anlisis de sangre mientras recibe [place]. Tal vez necesita seguir Counselling psychologist. Consulte a su mdico. Los alimentos que contienen hierro incluyen: alimentos integrales o con cereales, frutas secas, frijoles o arvejas, vegetales de hoja verde y carne que proviene de rganos (hgado, rin). Qu efectos secundarios puedo tener al [employer] este medicamento? Efectos secundarios que debe informar a su mdico o a Barrister's clerk de la salud tan pronto como sea posible:  Chief of Staff como erupcin cutnea, picazn o urticarias, hinchazn de la cara, labios o lengua  problemas respiratorios  cambios en la presin sangunea  tos  pulso cardiaco rpido, irregular  sensacin de desmayos o mareos, cadas  fiebre o escalofros  enrojecimiento, sudoracin o sensacin de calor  molestias o dolores musculares o articulares  convulsiones  hinchazn de los tobillos o pies  cansancio o debilidad inusual Efectos secundarios que, por  lo general, no requieren atencin mdica (debe informarlos a su mdico o a su profesional de la  salud si persisten o si son molestos):  diarrea  sensacin de molestias muculares  dolor de cabeza  irritacin en el lugar de la inyeccin  nuseas, vmito  Higher education careers adviser  cansancio Puede ser que esta lista no menciona todos los posibles efectos secundarios. Comunquese a su mdico por asesoramiento mdico [employer]. Usted puede informar los efectos secundarios a la FDA por telfono al 1-800-FDA-1088. Dnde debo guardar mi medicina? Este medicamento se administra en hospitales o clnicas y no necesitar guardarlo en su domicilio. ATENCIN: Este folleto es un resumen. Puede ser que no cubra toda la posible informacin. Si usted tiene preguntas acerca de esta medicina, consulte con su mdico, su farmacutico o su profesional de Technical sales engineer.  2020 Elsevier/Gold Standard (2016-05-21 00:00:00)

## 2019-05-18 ENCOUNTER — Inpatient Hospital Stay: Payer: Managed Care, Other (non HMO)

## 2019-05-18 ENCOUNTER — Other Ambulatory Visit: Payer: Self-pay

## 2019-05-18 VITALS — BP 108/70 | HR 63 | Temp 98.5°F | Resp 16

## 2019-05-18 DIAGNOSIS — D509 Iron deficiency anemia, unspecified: Secondary | ICD-10-CM

## 2019-05-18 MED ORDER — SODIUM CHLORIDE 0.9 % IV SOLN
200.0000 mg | Freq: Once | INTRAVENOUS | Status: AC
  Administered 2019-05-18: 200 mg via INTRAVENOUS
  Filled 2019-05-18: qty 200

## 2019-05-18 MED ORDER — ACETAMINOPHEN 325 MG PO TABS
650.0000 mg | ORAL_TABLET | Freq: Once | ORAL | Status: AC
  Administered 2019-05-18: 650 mg via ORAL

## 2019-05-18 MED ORDER — SODIUM CHLORIDE 0.9 % IV SOLN
Freq: Once | INTRAVENOUS | Status: AC
  Filled 2019-05-18: qty 250

## 2019-05-18 MED ORDER — ACETAMINOPHEN 325 MG PO TABS
ORAL_TABLET | ORAL | Status: AC
  Filled 2019-05-18: qty 2

## 2019-05-18 NOTE — Patient Instructions (Signed)
Iron Sucrose injection Qu es este medicamento? [place] HIERRO es un complejo de hierro. El hierro se [place] para la produccin de glbulos rojos sanos, los cuales transportan el oxgeno y los nutrientes hacia todo el cuerpo. Este medicamento se [place] para tratar la anemia a causa de deficiencia de hierro en personas con enfermedad renal crnica. Este medicamento puede ser utilizado para otros usos; si tiene alguna pregunta consulte con su proveedor de atencin mdica o con su farmacutico. MARCAS COMUNES: Venofer Qu le debo informar a mi profesional de la salud antes de tomar este medicamento? Necesita saber si usted presenta alguno de los [employer] o situaciones:  anemia no provocada por niveles bajos de hierro  enfermedad cardiaca  niveles altos de hierro en la sangre  enfermedad renal  enfermedad heptica  una reaccin alrgica o inusual al hierro, otros medicamentos, alimentos, colorantes o conservantes  si est embarazada o buscando quedar embarazada  si est amamantando a un beb Cmo debo Insurance account manager medicamento? Este medicamento se administra mediante infusin por va intravenosa. Lo administra un profesional de Technical sales engineer en un hospital o en un entorno clnico. Hable con su pediatra para informarse acerca del uso de este medicamento en nios. Aunque este medicamento se puede recetar a nios tan pequeos como de 2 aos de edad en casos selectos, existen precauciones que deben tomarse. Sobredosis: Pngase en contacto inmediatamente con un centro toxicolgico o una sala de urgencia si usted cree que haya tomado demasiado medicamento. ATENCIN: [employer] es solo para usted. No comparta este medicamento con nadie. Qu sucede si me olvido de una dosis? Es importante no olvidar ninguna dosis. Informe a su mdico o a su profesional de la salud si no puede asistir a Photographer. Qu puede interactuar con este medicamento? No tome esta medicina con ninguno de  los siguientes medicamentos:  deferoxamina  dimercaprol  otros productos que Production manager hierro Esta medicina tambin puede Counselling psychologist con los siguientes medicamentos:  cloranfenicol  deferasirox Puede ser que esta lista no menciona todas las posibles interacciones. Informe a su profesional de [employer]	[place] de [employer] productos a base de hierbas, medicamentos de [employer] o suplementos nutritivos que est tomando. Si usted fuma, consume bebidas alcohlicas o si utiliza drogas ilegales, indqueselo tambin a su profesional de [employer]	[place]. Algunas sustancias pueden interactuar con su medicamento. A qu debo estar atento al usar [employer]? Visite a su mdico o a su profesional de la salud de [place] regular. Si los sntomas no comienzan a mejorar o si empeoran, consulte con su mdico o con su profesional de [employer]	[place]. Tal vez necesita realizarse anlisis de sangre mientras recibe [place]. Tal vez necesita seguir Counselling psychologist. Consulte a su mdico. Los alimentos que contienen hierro incluyen: alimentos integrales o con cereales, frutas secas, frijoles o arvejas, vegetales de hoja verde y carne que proviene de rganos (hgado, rin). Qu efectos secundarios puedo tener al [employer] este medicamento? Efectos secundarios que debe informar a su mdico o a Barrister's clerk de la salud tan pronto como sea posible:  Chief of Staff como erupcin cutnea, picazn o urticarias, hinchazn de la cara, labios o lengua  problemas respiratorios  cambios en la presin sangunea  tos  pulso cardiaco rpido, irregular  sensacin de desmayos o mareos, cadas  fiebre o escalofros  enrojecimiento, sudoracin o sensacin de calor  molestias o dolores musculares o articulares  convulsiones  hinchazn de los tobillos o pies  cansancio o debilidad inusual Efectos secundarios que, por  lo general, no requieren atencin mdica (debe informarlos a su mdico o a su profesional de la  salud si persisten o si son molestos):  diarrea  sensacin de molestias muculares  dolor de cabeza  irritacin en el lugar de la inyeccin  nuseas, vmito  Higher education careers adviser  cansancio Puede ser que esta lista no menciona todos los posibles efectos secundarios. Comunquese a su mdico por asesoramiento mdico [employer]. Usted puede informar los efectos secundarios a la FDA por telfono al 1-800-FDA-1088. Dnde debo guardar mi medicina? Este medicamento se administra en hospitales o clnicas y no necesitar guardarlo en su domicilio. ATENCIN: Este folleto es un resumen. Puede ser que no cubra toda la posible informacin. Si usted tiene preguntas acerca de esta medicina, consulte con su mdico, su farmacutico o su profesional de Technical sales engineer.  2020 Elsevier/Gold Standard (2016-05-21 00:00:00)

## 2019-05-23 ENCOUNTER — Other Ambulatory Visit: Payer: Self-pay

## 2019-05-23 ENCOUNTER — Inpatient Hospital Stay: Payer: Managed Care, Other (non HMO)

## 2019-05-23 VITALS — BP 107/76 | HR 67 | Temp 98.0°F | Resp 18

## 2019-05-23 DIAGNOSIS — D509 Iron deficiency anemia, unspecified: Secondary | ICD-10-CM

## 2019-05-23 MED ORDER — ACETAMINOPHEN 325 MG PO TABS
650.0000 mg | ORAL_TABLET | Freq: Once | ORAL | Status: AC
  Administered 2019-05-23: 650 mg via ORAL

## 2019-05-23 MED ORDER — SODIUM CHLORIDE 0.9 % IV SOLN
Freq: Once | INTRAVENOUS | Status: AC
  Filled 2019-05-23: qty 250

## 2019-05-23 MED ORDER — ACETAMINOPHEN 325 MG PO TABS
ORAL_TABLET | ORAL | Status: AC
  Filled 2019-05-23: qty 2

## 2019-05-23 MED ORDER — SODIUM CHLORIDE 0.9 % IV SOLN
200.0000 mg | Freq: Once | INTRAVENOUS | Status: AC
  Administered 2019-05-23: 200 mg via INTRAVENOUS
  Filled 2019-05-23: qty 200

## 2019-05-23 NOTE — Patient Instructions (Signed)
Iron Sucrose injection What is this medicine? IRON SUCROSE (AHY ern SOO krohs) is an iron complex. Iron is used to make healthy red blood cells, which carry oxygen and nutrients throughout the body. This medicine is used to treat iron deficiency anemia in people with chronic kidney disease. This medicine may be used for other purposes; ask your health care provider or pharmacist if you have questions. COMMON BRAND NAME(S): Venofer What should I tell my health care provider before I take this medicine? They need to know if you have any of these conditions:  anemia not caused by low iron levels  heart disease  high levels of iron in the blood  kidney disease  liver disease  an unusual or allergic reaction to iron, other medicines, foods, dyes, or preservatives  pregnant or trying to get pregnant  breast-feeding How should I use this medicine? This medicine is for infusion into a vein. It is given by a health care professional in a hospital or clinic setting. Talk to your pediatrician regarding the use of this medicine in children. While this drug may be prescribed for children as young as 2 years for selected conditions, precautions do apply. Overdosage: If you think you have taken too much of this medicine contact a poison control center or emergency room at once. NOTE: This medicine is only for you. Do not share this medicine with others. What if I miss a dose? It is important not to miss your dose. Call your doctor or health care professional if you are unable to keep an appointment. What may interact with this medicine? Do not take this medicine with any of the following medications:  deferoxamine  dimercaprol  other iron products This medicine may also interact with the following medications:  chloramphenicol  deferasirox This list may not describe all possible interactions. Give your health care provider a list of all the medicines, herbs, non-prescription drugs, or  dietary supplements you use. Also tell them if you smoke, drink alcohol, or use illegal drugs. Some items may interact with your medicine. What should I watch for while using this medicine? Visit your doctor or healthcare professional regularly. Tell your doctor or healthcare professional if your symptoms do not start to get better or if they get worse. You may need blood work done while you are taking this medicine. You may need to follow a special diet. Talk to your doctor. Foods that contain iron include: whole grains/cereals, dried fruits, beans, or peas, leafy green vegetables, and organ meats (liver, kidney). What side effects may I notice from receiving this medicine? Side effects that you should report to your doctor or health care professional as soon as possible:  allergic reactions like skin rash, itching or hives, swelling of the face, lips, or tongue  breathing problems  changes in blood pressure  cough  fast, irregular heartbeat  feeling faint or lightheaded, falls  fever or chills  flushing, sweating, or hot feelings  joint or muscle aches/pains  seizures  swelling of the ankles or feet  unusually weak or tired Side effects that usually do not require medical attention (report to your doctor or health care professional if they continue or are bothersome):  diarrhea  feeling achy  headache  irritation at site where injected  nausea, vomiting  stomach upset  tiredness This list may not describe all possible side effects. Call your doctor for medical advice about side effects. You may report side effects to FDA at 1-800-FDA-1088. Where should I keep   my medicine? This drug is given in a hospital or clinic and will not be stored at home. NOTE: This sheet is a summary. It may not cover all possible information. If you have questions about this medicine, talk to your doctor, pharmacist, or health care provider.  2020 Elsevier/Gold Standard (2011-01-29  17:14:35) Coronavirus (COVID-19) Are you at risk?  Are you at risk for the Coronavirus (COVID-19)?  To be considered HIGH RISK for Coronavirus (COVID-19), you have to meet the following criteria:  . Traveled to [place], [place], [place], [place] or [place]; or in the [place] to [place], [place], [place], or [place]; and have fever, cough, and shortness of breath within the last 2 weeks of travel OR . Been in close contact with a person diagnosed with COVID-19 within the last 2 weeks and have fever, cough, and shortness of breath . IF YOU DO NOT MEET THESE CRITERIA, YOU ARE CONSIDERED LOW RISK FOR COVID-19.  What to do if you are HIGH RISK for COVID-19?  . If you are having a medical emergency, call 911. . Seek medical care right away. Before you go to a doctor's office, urgent care or emergency department, call ahead and tell them about your recent travel, contact with someone diagnosed with COVID-19, and your symptoms. You should receive instructions from your physician's office regarding next steps of care.  . When you arrive at healthcare provider, tell the healthcare staff immediately you have returned from visiting [place], [place], [place], [place] or [place]; or traveled in the [place] to [place], [place], [place], or [place]; in the last two weeks or you have been in close contact with a person diagnosed with COVID-19 in the last 2 weeks.   . Tell the health care staff about your symptoms: fever, cough and shortness of breath. . After you have been seen by a medical provider, you will be either: o Tested for (COVID-19) and discharged home on quarantine except to seek medical care if symptoms worsen, and asked to  - Stay home and avoid contact with others until you get your results (4-5 days)  - Avoid travel on public transportation if possible (such as bus, train, or airplane) or o Sent to the Emergency Department by EMS for evaluation, COVID-19 testing, and  possible admission depending on your condition and test results.  What to do if you are LOW RISK for COVID-19?  Reduce your risk of any infection by using the same precautions used for avoiding the common cold or flu:  . Wash your hands often with soap and warm water for at least 20 seconds.  If soap and water are not readily available, use an alcohol-based hand sanitizer with at least 60% alcohol.  . If coughing or sneezing, cover your mouth and nose by coughing or sneezing into the elbow areas of your shirt or coat, into a tissue or into your sleeve (not your hands). . Avoid shaking hands with others and consider head nods or verbal greetings only. . Avoid touching your eyes, nose, or mouth with unwashed hands.  . Avoid close contact with people who are sick. . Avoid places or events with large numbers of people in one location, like concerts or sporting events. . Carefully consider travel plans you have or are making. . If you are planning any travel outside or inside the [place], visit the CDC's Travelers' Health webpage for the latest health notices. . If you have some symptoms but not all   symptoms, continue to monitor at home and seek medical attention if your symptoms worsen. . If you are having a medical emergency, call 911.   ADDITIONAL HEALTHCARE OPTIONS FOR PATIENTS  [facility] / e-Visit: [email or web address]         [facility]: [phone]  [facility]: [phone]                   [facility]: [phone]  

## 2019-05-23 NOTE — Progress Notes (Signed)
Patient declined to be observed for 30 minutes after her Venofer infusion but stayed for 15 minutes. Verbal education provided. Vitals obtained and documented. Denies any complain or concerns at this time.

## 2019-05-25 ENCOUNTER — Other Ambulatory Visit: Payer: Self-pay

## 2019-05-25 ENCOUNTER — Inpatient Hospital Stay: Payer: Managed Care, Other (non HMO)

## 2019-05-25 ENCOUNTER — Encounter: Payer: Self-pay | Admitting: Hematology and Oncology

## 2019-05-25 VITALS — BP 104/74 | HR 71 | Temp 98.4°F | Resp 17

## 2019-05-25 DIAGNOSIS — D509 Iron deficiency anemia, unspecified: Secondary | ICD-10-CM

## 2019-05-25 MED ORDER — ACETAMINOPHEN 325 MG PO TABS
ORAL_TABLET | ORAL | Status: AC
  Filled 2019-05-25: qty 2

## 2019-05-25 MED ORDER — SODIUM CHLORIDE 0.9 % IV SOLN
Freq: Once | INTRAVENOUS | Status: AC
  Filled 2019-05-25: qty 250

## 2019-05-25 MED ORDER — ACETAMINOPHEN 325 MG PO TABS
650.0000 mg | ORAL_TABLET | Freq: Once | ORAL | Status: AC
  Administered 2019-05-25: 650 mg via ORAL

## 2019-05-25 MED ORDER — SODIUM CHLORIDE 0.9 % IV SOLN
200.0000 mg | Freq: Once | INTRAVENOUS | Status: AC
  Administered 2019-05-25: 200 mg via INTRAVENOUS
  Filled 2019-05-25: qty 200

## 2019-05-25 NOTE — Patient Instructions (Signed)
Iron Sucrose injection Qu es este medicamento? [place] HIERRO es un complejo de hierro. El hierro se [place] para la produccin de glbulos rojos sanos, los cuales transportan el oxgeno y los nutrientes hacia todo el cuerpo. Este medicamento se [place] para tratar la anemia a causa de deficiencia de hierro en personas con enfermedad renal crnica. Este medicamento puede ser utilizado para otros usos; si tiene alguna pregunta consulte con su proveedor de atencin mdica o con su farmacutico. MARCAS COMUNES: Venofer Qu le debo informar a mi profesional de la salud antes de tomar este medicamento? Necesita saber si usted presenta alguno de los [employer] o situaciones:  anemia no provocada por niveles bajos de hierro  enfermedad cardiaca  niveles altos de hierro en la sangre  enfermedad renal  enfermedad heptica  una reaccin alrgica o inusual al hierro, otros medicamentos, alimentos, colorantes o conservantes  si est embarazada o buscando quedar embarazada  si est amamantando a un beb Cmo debo Insurance account manager medicamento? Este medicamento se administra mediante infusin por va intravenosa. Lo administra un profesional de Technical sales engineer en un hospital o en un entorno clnico. Hable con su pediatra para informarse acerca del uso de este medicamento en nios. Aunque este medicamento se puede recetar a nios tan pequeos como de 2 aos de edad en casos selectos, existen precauciones que deben tomarse. Sobredosis: Pngase en contacto inmediatamente con un centro toxicolgico o una sala de urgencia si usted cree que haya tomado demasiado medicamento. ATENCIN: [employer] es solo para usted. No comparta este medicamento con nadie. Qu sucede si me olvido de una dosis? Es importante no olvidar ninguna dosis. Informe a su mdico o a su profesional de la salud si no puede asistir a Photographer. Qu puede interactuar con este medicamento? No tome esta medicina con ninguno de  los siguientes medicamentos:  deferoxamina  dimercaprol  otros productos que Production manager hierro Esta medicina tambin puede Counselling psychologist con los siguientes medicamentos:  cloranfenicol  deferasirox Puede ser que esta lista no menciona todas las posibles interacciones. Informe a su profesional de [employer]	[place] de [employer] productos a base de hierbas, medicamentos de [employer] o suplementos nutritivos que est tomando. Si usted fuma, consume bebidas alcohlicas o si utiliza drogas ilegales, indqueselo tambin a su profesional de [employer]	[place]. Algunas sustancias pueden interactuar con su medicamento. A qu debo estar atento al usar [employer]? Visite a su mdico o a su profesional de la salud de [place] regular. Si los sntomas no comienzan a mejorar o si empeoran, consulte con su mdico o con su profesional de [employer]	[place]. Tal vez necesita realizarse anlisis de sangre mientras recibe [place]. Tal vez necesita seguir Counselling psychologist. Consulte a su mdico. Los alimentos que contienen hierro incluyen: alimentos integrales o con cereales, frutas secas, frijoles o arvejas, vegetales de hoja verde y carne que proviene de rganos (hgado, rin). Qu efectos secundarios puedo tener al [employer] este medicamento? Efectos secundarios que debe informar a su mdico o a Barrister's clerk de la salud tan pronto como sea posible:  Chief of Staff como erupcin cutnea, picazn o urticarias, hinchazn de la cara, labios o lengua  problemas respiratorios  cambios en la presin sangunea  tos  pulso cardiaco rpido, irregular  sensacin de desmayos o mareos, cadas  fiebre o escalofros  enrojecimiento, sudoracin o sensacin de calor  molestias o dolores musculares o articulares  convulsiones  hinchazn de los tobillos o pies  cansancio o debilidad inusual Efectos secundarios que, por  lo general, no requieren atencin mdica (debe informarlos a su mdico o a su profesional de la  salud si persisten o si son molestos):  diarrea  sensacin de molestias muculares  dolor de cabeza  irritacin en el lugar de la inyeccin  nuseas, vmito  Higher education careers adviser  cansancio Puede ser que esta lista no menciona todos los posibles efectos secundarios. Comunquese a su mdico por asesoramiento mdico [employer]. Usted puede informar los efectos secundarios a la FDA por telfono al 1-800-FDA-1088. Dnde debo guardar mi medicina? Este medicamento se administra en hospitales o clnicas y no necesitar guardarlo en su domicilio. ATENCIN: Este folleto es un resumen. Puede ser que no cubra toda la posible informacin. Si usted tiene preguntas acerca de esta medicina, consulte con su mdico, su farmacutico o su profesional de Technical sales engineer.  2020 Elsevier/Gold Standard (2016-05-21 00:00:00)

## 2019-05-25 NOTE — Progress Notes (Signed)
Patient declined 30 post observation period. Patient tolerated treatment well. VSS.

## 2019-06-12 ENCOUNTER — Ambulatory Visit: Payer: Self-pay | Admitting: Obstetrics and Gynecology

## 2019-06-15 LAB — HM PAP SMEAR: HM Pap smear: NEGATIVE

## 2019-07-24 ENCOUNTER — Other Ambulatory Visit: Payer: Self-pay

## 2019-07-24 ENCOUNTER — Telehealth: Payer: Self-pay | Admitting: Family Medicine

## 2019-07-24 DIAGNOSIS — D509 Iron deficiency anemia, unspecified: Secondary | ICD-10-CM

## 2019-07-24 NOTE — Telephone Encounter (Signed)
Pt is wondering if she travels to [place] in [place] if that will make her ineligible to get the COVID vaccine?  She also wants to make sure she is ok to receive the COVID vaccine and if anemia is considered high risk for the vaccine?    Pt can be reached at [phone]

## 2019-07-25 ENCOUNTER — Inpatient Hospital Stay: Payer: Managed Care, Other (non HMO) | Attending: Hematology and Oncology

## 2019-07-25 ENCOUNTER — Other Ambulatory Visit: Payer: Self-pay

## 2019-07-25 DIAGNOSIS — D509 Iron deficiency anemia, unspecified: Secondary | ICD-10-CM

## 2019-07-25 LAB — CBC WITH DIFFERENTIAL (CANCER CENTER ONLY)
Abs Immature Granulocytes: 0.04 10*3/uL (ref 0.00–0.07)
Basophils Absolute: 0 10*3/uL (ref 0.0–0.1)
Basophils Relative: 0 %
Eosinophils Absolute: 0.1 10*3/uL (ref 0.0–0.5)
Eosinophils Relative: 1 %
HCT: 40.1 % (ref 36.0–46.0)
Hemoglobin: 13.3 g/dL (ref 12.0–15.0)
Immature Granulocytes: 1 %
Lymphocytes Relative: 35 %
Lymphs Abs: 2.6 10*3/uL (ref 0.7–4.0)
MCH: 29.8 pg (ref 26.0–34.0)
MCHC: 33.2 g/dL (ref 30.0–36.0)
MCV: 89.7 fL (ref 80.0–100.0)
Monocytes Absolute: 0.5 10*3/uL (ref 0.1–1.0)
Monocytes Relative: 7 %
Neutro Abs: 4.1 10*3/uL (ref 1.7–7.7)
Neutrophils Relative %: 56 %
Platelet Count: 274 10*3/uL (ref 150–400)
RBC: 4.47 MIL/uL (ref 3.87–5.11)
RDW: 12.6 % (ref 11.5–15.5)
WBC Count: 7.3 10*3/uL (ref 4.0–10.5)
nRBC: 0 % (ref 0.0–0.2)

## 2019-07-25 LAB — IRON AND TIBC
Iron: 85 ug/dL (ref 41–142)
Saturation Ratios: 27 % (ref 21–57)
TIBC: 316 ug/dL (ref 236–444)
UIBC: 231 ug/dL (ref 120–384)

## 2019-07-25 LAB — FERRITIN: Ferritin: 92 ng/mL (ref 11–307)

## 2019-07-25 NOTE — Telephone Encounter (Signed)
I am not aware of any travel restrictions for getting the COVID vaccine. Her labs completed today look great actually! No apparent anemia. And so no limitations to vaccine for her.

## 2019-07-26 NOTE — Progress Notes (Signed)
Patient Care Team: [name], DO as PCP - General (Family Medicine) Princess Bruins, MD as Attending Physician (Obstetrics and Gynecology)  DIAGNOSIS:    ICD-10-CM   1. Iron deficiency anemia, unspecified iron deficiency anemia type  D50.9     CHIEF COMPLIANT: Follow-up of iron deficiency anemia  INTERVAL HISTORY: Michele Gilbert is a 39 y.o. with above-mentioned history of iron deficiency anemia treated with IV iron therapy. Labs on 07/25/19 showed: Hg 13.3, HCT 40.1, iron saturation 27%, ferritin 92. She presents to the clinic today for follow-up.  She informed me that overall she is feeling significantly better.  Her mood swings have come down and shes got more energy.  ALLERGIES:  is allergic to latex.  MEDICATIONS:  Current Outpatient Medications  Medication Sig Dispense Refill  . Bisacodyl (DULCOLAX PO) Take by mouth as needed.    . busPIRone (BUSPAR) 10 MG tablet Take 1 tablet (10 mg total) by mouth 2 (two) times daily. 60 tablet 3  . VITAMIN D PO Take by mouth.     No current facility-administered medications for this visit.    PHYSICAL EXAMINATION: ECOG PERFORMANCE STATUS: 1 - Symptomatic but completely ambulatory  There were no vitals filed for this visit. There were no vitals filed for this visit.  LABORATORY DATA:  I have reviewed the data as listed CMP Latest Ref Rng & Units 04/05/2019 09/23/2018 09/17/2016  Glucose 70 - 99 mg/dL 84 82 75  BUN 6 - 23 mg/dL 16 15 13   Creatinine 0.40 - 1.20 mg/dL 0.60 0.70 0.64  Sodium 135 - 145 mEq/L 140 140 140  Potassium 3.5 - 5.1 mEq/L 4.6 4.1 4.6  Chloride 96 - 112 mEq/L 105 103 103  CO2 19 - 32 mEq/L 27 28 29   Calcium 8.4 - 10.5 mg/dL 9.6 9.7 9.6  Total Protein 6.0 - 8.3 g/dL 7.4 7.7 -  Total Bilirubin 0.2 - 1.2 mg/dL 0.2 0.3 -  Alkaline Phos 39 - 117 U/L 68 81 -  AST 0 - 37 U/L 17 14 -  ALT 0 - 35 U/L 17 11 -    Lab Results  Component Value Date   WBC 7.3 07/25/2019   HGB 13.3 07/25/2019   HCT 40.1  07/25/2019   MCV 89.7 07/25/2019   PLT 274 07/25/2019   NEUTROABS 4.1 07/25/2019    ASSESSMENT & PLAN:  Iron deficiency anemia Lab review 04/05/2019: Hemoglobin 12.9, MCV 86.1, ferritin 8.6, iron saturation 13.5 Patient was unable to tolerate oral iron. IV iron: January 2021  Lab review: 07/25/2019: Iron saturation 27%, ferritin 92 (improved from 8.6), hemoglobin 13.3, MCV 89.7 She had excellent response to IV iron treatment.  Clinically she has had a remarkable improvement in her affect with IV iron treatment.  She plans to go to [place] in May.  When she comes back we will see her back again. Return to clinic in 3 months with labs done ahead of time.    No orders of the defined types were placed in this encounter.  The patient has a good understanding of the overall plan. she agrees with it. she will call with any problems that may develop before the next visit here.  Total time spent: 20 mins including face to face time and time spent for planning, charting and coordination of care  Nicholas Lose, MD 07/27/2019  I, Cloyde Reams Dorshimer, am acting as scribe for Dr. Nicholas Lose.  I have reviewed the above documentation for accuracy and completeness, and I agree  with the above.

## 2019-07-26 NOTE — Telephone Encounter (Signed)
Spoke with the pt and informed her of the message below.   

## 2019-07-27 ENCOUNTER — Inpatient Hospital Stay (HOSPITAL_BASED_OUTPATIENT_CLINIC_OR_DEPARTMENT_OTHER): Payer: Managed Care, Other (non HMO) | Admitting: Hematology and Oncology

## 2019-07-27 ENCOUNTER — Other Ambulatory Visit: Payer: Self-pay

## 2019-07-27 DIAGNOSIS — D509 Iron deficiency anemia, unspecified: Secondary | ICD-10-CM

## 2019-07-27 NOTE — Assessment & Plan Note (Signed)
Lab review 04/05/2019: Hemoglobin 12.9, MCV 86.1, ferritin 8.6, iron saturation 13.5 Patient was unable to tolerate oral iron. IV iron: January 2021  Lab review: 07/25/2019: Iron saturation 27%, ferritin 92 (improved from 8.6), hemoglobin 13.3, MCV 89.7 She had excellent response to IV iron treatment. Return to clinic in 3 months with labs done ahead of time.

## 2019-07-31 ENCOUNTER — Telehealth: Payer: Self-pay | Admitting: Hematology and Oncology

## 2019-07-31 NOTE — Telephone Encounter (Signed)
Scheduled per 03/25 los, patient has been called and notified. 

## 2019-09-13 ENCOUNTER — Telehealth: Payer: Self-pay | Admitting: Family Medicine

## 2019-09-13 ENCOUNTER — Ambulatory Visit: Payer: Managed Care, Other (non HMO) | Admitting: Family Medicine

## 2019-09-13 DIAGNOSIS — Z0289 Encounter for other administrative examinations: Secondary | ICD-10-CM

## 2019-09-13 NOTE — Telephone Encounter (Signed)
Pt found a tick on her stomach this morning and has removed it. Pt is not sure if she needs to be seen? Pt feels fine and has no symptoms. Thanks

## 2019-09-13 NOTE — Telephone Encounter (Signed)
Pt has been scheduled for visit.  

## 2019-09-27 ENCOUNTER — Ambulatory Visit: Payer: Managed Care, Other (non HMO) | Attending: Internal Medicine

## 2019-09-27 DIAGNOSIS — Z20822 Contact with and (suspected) exposure to covid-19: Secondary | ICD-10-CM

## 2019-09-28 LAB — SARS-COV-2, NAA 2 DAY TAT

## 2019-09-28 LAB — NOVEL CORONAVIRUS, NAA: SARS-CoV-2, NAA: NOT DETECTED

## 2019-10-24 ENCOUNTER — Other Ambulatory Visit: Payer: Self-pay | Admitting: *Deleted

## 2019-10-24 DIAGNOSIS — D509 Iron deficiency anemia, unspecified: Secondary | ICD-10-CM

## 2019-10-25 ENCOUNTER — Inpatient Hospital Stay: Payer: Medicaid Other | Attending: Hematology and Oncology

## 2019-10-25 ENCOUNTER — Telehealth: Payer: Self-pay | Admitting: Hematology and Oncology

## 2019-10-25 ENCOUNTER — Other Ambulatory Visit: Payer: Self-pay

## 2019-10-25 DIAGNOSIS — D509 Iron deficiency anemia, unspecified: Secondary | ICD-10-CM

## 2019-10-25 LAB — CBC WITH DIFFERENTIAL (CANCER CENTER ONLY)
Abs Immature Granulocytes: 0.05 10*3/uL (ref 0.00–0.07)
Basophils Absolute: 0 10*3/uL (ref 0.0–0.1)
Basophils Relative: 0 %
Eosinophils Absolute: 0.1 10*3/uL (ref 0.0–0.5)
Eosinophils Relative: 1 %
HCT: 41.6 % (ref 36.0–46.0)
Hemoglobin: 14 g/dL (ref 12.0–15.0)
Immature Granulocytes: 1 %
Lymphocytes Relative: 36 %
Lymphs Abs: 2.9 10*3/uL (ref 0.7–4.0)
MCH: 29.6 pg (ref 26.0–34.0)
MCHC: 33.7 g/dL (ref 30.0–36.0)
MCV: 87.9 fL (ref 80.0–100.0)
Monocytes Absolute: 0.6 10*3/uL (ref 0.1–1.0)
Monocytes Relative: 7 %
Neutro Abs: 4.4 10*3/uL (ref 1.7–7.7)
Neutrophils Relative %: 55 %
Platelet Count: 296 10*3/uL (ref 150–400)
RBC: 4.73 MIL/uL (ref 3.87–5.11)
RDW: 12.4 % (ref 11.5–15.5)
WBC Count: 8 10*3/uL (ref 4.0–10.5)
nRBC: 0 % (ref 0.0–0.2)

## 2019-10-25 LAB — IRON AND TIBC
Iron: 99 ug/dL (ref 41–142)
Saturation Ratios: 29 % (ref 21–57)
TIBC: 344 ug/dL (ref 236–444)
UIBC: 245 ug/dL (ref 120–384)

## 2019-10-25 LAB — FERRITIN: Ferritin: 85 ng/mL (ref 11–307)

## 2019-10-25 NOTE — Telephone Encounter (Signed)
Patient needed to reschedule 6/25 appt. Moved to 7/6 per patient request

## 2019-10-27 ENCOUNTER — Ambulatory Visit: Payer: Managed Care, Other (non HMO) | Admitting: Hematology and Oncology

## 2019-11-06 NOTE — Progress Notes (Signed)
Patient Care Team: [name], DO as PCP - General (Family Medicine) Princess Bruins, MD as Attending Physician (Obstetrics and Gynecology)  DIAGNOSIS:    ICD-10-CM   1. Iron deficiency anemia, unspecified iron deficiency anemia type  D50.9 Iron and TIBC    CBC with Differential (Cancer Center Only)    Ferritin    CHIEF COMPLIANT: Follow-up of iron deficiency anemia  INTERVAL HISTORY: Michele Gilbert is a 39 y.o. with above-mentioned history of iron deficiency anemia treated with IV iron therapy. Labs on 10/25/19 showed: Hg 14.0, HCT 41.6, iron saturation 29%, ferritin 85. She presents to the clinic todayfor follow-up.    She recently had her menstrual cycle and she informs me that during that phase she feels extremely exhausted and wiped out.  She has not discussed this with her gynecologist.  Today she feels much better.  Her labs done 2 days ago did not show any further evidence of iron deficiency.  ALLERGIES:  is allergic to latex.  MEDICATIONS:  Current Outpatient Medications  Medication Sig Dispense Refill  . Bisacodyl (DULCOLAX PO) Take by mouth as needed.    . busPIRone (BUSPAR) 10 MG tablet Take 1 tablet (10 mg total) by mouth 2 (two) times daily. 60 tablet 3  . VITAMIN D PO Take by mouth.     No current facility-administered medications for this visit.    PHYSICAL EXAMINATION: ECOG PERFORMANCE STATUS: 1 - Symptomatic but completely ambulatory  Vitals:   11/07/19 0831  BP: 113/71  Pulse: 64  Resp: 16  Temp: 98.7 F (37.1 C)  SpO2: 100%   Filed Weights   11/07/19 0831  Weight: 139 lb 12.8 oz (63.4 kg)    LABORATORY DATA:  I have reviewed the data as listed CMP Latest Ref Rng & Units 04/05/2019 09/23/2018 09/17/2016  Glucose 70 - 99 mg/dL 84 82 75  BUN 6 - 23 mg/dL 16 15 13   Creatinine 0.40 - 1.20 mg/dL 0.60 0.70 0.64  Sodium 135 - 145 mEq/L 140 140 140  Potassium 3.5 - 5.1 mEq/L 4.6 4.1 4.6  Chloride 96 - 112 mEq/L 105 103 103  CO2 19 - 32 mEq/L 27  28 29   Calcium 8.4 - 10.5 mg/dL 9.6 9.7 9.6  Total Protein 6.0 - 8.3 g/dL 7.4 7.7 -  Total Bilirubin 0.2 - 1.2 mg/dL 0.2 0.3 -  Alkaline Phos 39 - 117 U/L 68 81 -  AST 0 - 37 U/L 17 14 -  ALT 0 - 35 U/L 17 11 -    Lab Results  Component Value Date   WBC 8.0 10/25/2019   HGB 14.0 10/25/2019   HCT 41.6 10/25/2019   MCV 87.9 10/25/2019   PLT 296 10/25/2019   NEUTROABS 4.4 10/25/2019    ASSESSMENT & PLAN:  Iron deficiency anemia Lab review 04/05/2019: Hemoglobin 12.9, MCV 86.1, ferritin 8.6, iron saturation 13.5 Patient was unable to tolerate oral iron. IV iron: January 2021  Lab review:  07/25/2019: Iron saturation 27%, ferritin 92 (improved from 8.6), hemoglobin 13.3, MCV 89.7 10/25/2019: Hemoglobin 14, MCV 87.9, ferritin 85, iron saturation 29%  Based on these results there is no indication for IV iron therapy at this time. Fatigue during menstrual cycles: I discussed with her that she needs to discuss this with her gynecologist.  There is no iron deficiency that we can identify at this time. No role of intravenous iron therapy.  Recheck labs and follow-up in 6 months.  If the labs are good then we  can see her once a year.    Orders Placed This Encounter  Procedures  . Iron and TIBC    Standing Status:   Future    Standing Expiration Date:   11/06/2020  . CBC with Differential (Cancer Center Only)    Standing Status:   Future    Standing Expiration Date:   11/06/2020  . Ferritin    Standing Status:   Future    Standing Expiration Date:   11/06/2020   The patient has a good understanding of the overall plan. she agrees with it. she will call with any problems that may develop before the next visit here.  Total time spent: 20 mins including face to face time and time spent for planning, charting and coordination of care  Nicholas Lose, MD 11/07/2019  I, Cloyde Reams Dorshimer, am acting as scribe for Dr. Nicholas Lose.  I have reviewed the above documentation for accuracy and  completeness, and I agree with the above.

## 2019-11-07 ENCOUNTER — Telehealth: Payer: Self-pay | Admitting: Hematology and Oncology

## 2019-11-07 ENCOUNTER — Other Ambulatory Visit: Payer: Self-pay

## 2019-11-07 ENCOUNTER — Inpatient Hospital Stay: Payer: BC Managed Care – PPO | Attending: Hematology and Oncology | Admitting: Hematology and Oncology

## 2019-11-07 DIAGNOSIS — Z79899 Other long term (current) drug therapy: Secondary | ICD-10-CM | POA: Insufficient documentation

## 2019-11-07 DIAGNOSIS — D509 Iron deficiency anemia, unspecified: Secondary | ICD-10-CM | POA: Diagnosis not present

## 2019-11-07 NOTE — Telephone Encounter (Signed)
Scheduled appts per 7/6 los. Gave pt a print out of AVS.

## 2019-11-07 NOTE — Assessment & Plan Note (Signed)
Lab review 04/05/2019: Hemoglobin 12.9, MCV 86.1, ferritin 8.6, iron saturation 13.5 Patient was unable to tolerate oral iron. IV iron: January 2021  Lab review:  07/25/2019: Iron saturation 27%, ferritin 92 (improved from 8.6), hemoglobin 13.3, MCV 89.7 10/25/2019: Hemoglobin 14, MCV 87.9, ferritin 85, iron saturation 29%  Based on these results there is no indication for IV iron therapy at this time. Recheck labs and follow-up in 4 months.

## 2019-11-27 DIAGNOSIS — L658 Other specified nonscarring hair loss: Secondary | ICD-10-CM | POA: Diagnosis not present

## 2019-11-27 DIAGNOSIS — L650 Telogen effluvium: Secondary | ICD-10-CM | POA: Diagnosis not present

## 2019-11-27 DIAGNOSIS — L219 Seborrheic dermatitis, unspecified: Secondary | ICD-10-CM | POA: Diagnosis not present

## 2019-12-27 DIAGNOSIS — L659 Nonscarring hair loss, unspecified: Secondary | ICD-10-CM | POA: Diagnosis not present

## 2020-02-07 ENCOUNTER — Encounter: Payer: Self-pay | Admitting: Family Medicine

## 2020-02-07 ENCOUNTER — Other Ambulatory Visit: Payer: Self-pay

## 2020-02-07 ENCOUNTER — Ambulatory Visit: Payer: Medicaid Other | Admitting: Family Medicine

## 2020-02-07 VITALS — BP 118/80 | HR 86 | Temp 98.1°F | Ht 63.0 in | Wt 133.0 lb

## 2020-02-07 DIAGNOSIS — L84 Corns and callosities: Secondary | ICD-10-CM

## 2020-02-07 NOTE — Patient Instructions (Signed)

## 2020-02-07 NOTE — Progress Notes (Signed)
Established Patient Office Visit  Subjective:  Patient ID: Michele Gilbert, female    DOB: May 26, 1980  Age: 39 y.o. MRN: [id number]  CC:  Chief Complaint  Patient presents with  . Foot Pain    HPI Michele Gilbert presents for left foot pain involving the ball of the foot.  She states summer before last she recalls concern that she had stepped on a foreign body.  She since that time has noted some callus that keeps recurring left foot.  She has a couple of times used nail clippers to try to trim this down.  Does not have any calluses on the right foot.  No history of diabetes.  Past Medical History:  Diagnosis Date  . Constipation   . Dysplastic nevus 09/22/2016   - R upper lateral arm (deltoid) -S/p biopsy 09/2016 - moderate atypia w/ recs from pathology for conservative re-excision if recurrent  pigmentation at site  . Headache(784.0)    frequent  . Mastitis   . Migraine   . UTI (urinary tract infection)     Past Surgical History:  Procedure Laterality Date  . NO PAST SURGERIES      Family History  Problem Relation Age of Onset  . Hyperlipidemia Father   . Vision loss Mother   . Colon cancer Other        grandparent  . Diabetes Other   . Cancer Maternal Grandmother        stomach  . Diabetes Paternal Grandfather   . Cancer Maternal Uncle        stomach; mets    Social History   Socioeconomic History  . Marital status: Married    Spouse name: Not on file  . Number of children: Not on file  . Years of education: Not on file  . Highest education level: Not on file  Occupational History  . Not on file  Tobacco Use  . Smoking status: Never Smoker  . Smokeless tobacco: Never Used  Substance and Sexual Activity  . Alcohol use: Yes    Alcohol/week: 1.0 standard drink    Types: 1 Glasses of wine per week    Comment: occasional  . Drug use: No  . Sexual activity: Yes    Birth control/protection: None  Other Topics Concern  . Not on file  Social History  Narrative   Work or School:      Home Situation: lives with husband and 2 children (3 and 40 yo in 2018)   From [place]      Spiritual Beliefs:      Lifestyle:   Social Determinants of Health   Financial Resource Strain:   . Difficulty of Paying Living Expenses: Not on file  Food Insecurity:   . Worried About Charity fundraiser in the Last Year: Not on file  . Ran Out of Food in the Last Year: Not on file  Transportation Needs:   . Lack of Transportation (Medical): Not on file  . Lack of Transportation (Non-Medical): Not on file  Physical Activity:   . Days of Exercise per Week: Not on file  . Minutes of Exercise per Session: Not on file  Stress:   . Feeling of Stress : Not on file  Social Connections:   . Frequency of Communication with Friends and Family: Not on file  . Frequency of Social Gatherings with Friends and Family: Not on file  . Attends Religious Services: Not on file  . Active Member of Clubs  or Organizations: Not on file  . Attends Archivist Meetings: Not on file  . Marital Status: Not on file  Intimate Partner Violence:   . Fear of Current or Ex-Partner: Not on file  . Emotionally Abused: Not on file  . Physically Abused: Not on file  . Sexually Abused: Not on file    Outpatient Medications Prior to Visit  Medication Sig Dispense Refill  . Bisacodyl (DULCOLAX PO) Take by mouth as needed.    Marland Kitchen VITAMIN D PO Take by mouth.    . busPIRone (BUSPAR) 10 MG tablet Take 1 tablet (10 mg total) by mouth 2 (two) times daily. 60 tablet 3   No facility-administered medications prior to visit.    Allergies  Allergen Reactions  . Latex Dermatitis    ROS Review of Systems  Constitutional: Negative for chills and fever.      Objective:    Physical Exam Vitals reviewed.  Constitutional:      Appearance: Normal appearance.  Cardiovascular:     Rate and Rhythm: Normal rate and regular rhythm.  Skin:    Comments: He has small area of callused  skin about 1/2 cm diameter left foot near the ball the foot.  No surrounding erythema.  Mildly tender to palpation. No foreign bodies seen or palpated.  Neurological:     Mental Status: She is alert.     BP 118/80 (BP Location: Left Arm, Cuff Size: Normal)   Pulse 86   Temp 98.1 F (36.7 C) (Oral)   Ht 5\' 3"  (1.6 m)   Wt 133 lb (60.3 kg)   SpO2 99%   BMI 23.56 kg/m  Wt Readings from Last 3 Encounters:  02/07/20 133 lb (60.3 kg)  11/07/19 139 lb 12.8 oz (63.4 kg)  07/27/19 138 lb (62.6 kg)     Health Maintenance Due  Topic Date Due  . Hepatitis C Screening  Never done  . PAP SMEAR-Modifier  10/02/2013  . INFLUENZA VACCINE  12/03/2019    There are no preventive care reminders to display for this patient.  Lab Results  Component Value Date   TSH 2.67 09/23/2018   Lab Results  Component Value Date   WBC 8.0 10/25/2019   HGB 14.0 10/25/2019   HCT 41.6 10/25/2019   MCV 87.9 10/25/2019   PLT 296 10/25/2019   Lab Results  Component Value Date   NA 140 04/05/2019   K 4.6 04/05/2019   CO2 27 04/05/2019   GLUCOSE 84 04/05/2019   BUN 16 04/05/2019   CREATININE 0.60 04/05/2019   BILITOT 0.2 04/05/2019   ALKPHOS 68 04/05/2019   AST 17 04/05/2019   ALT 17 04/05/2019   PROT 7.4 04/05/2019   ALBUMIN 4.6 04/05/2019   CALCIUM 9.6 04/05/2019   GFR 111.15 04/05/2019   Lab Results  Component Value Date   CHOL 168 09/23/2018   Lab Results  Component Value Date   HDL 33.60 (L) 09/23/2018   Lab Results  Component Value Date   LDLCALC 99 09/23/2018   Lab Results  Component Value Date   TRIG 178.0 (H) 09/23/2018   Lab Results  Component Value Date   CHOLHDL 5 09/23/2018   Lab Results  Component Value Date   HGBA1C 5.5 09/23/2018      Assessment & Plan:   Callus left foot.  No evidence for plantar wart.  We discussed risks and benefits of shave trim with 15 blade including risk of bleeding and very low risk of infection  and patient consented.  We prepped  the skin with alcohol swab.  Using #15 blade gently trimmed back the callus area.  She felt less pain afterwards. Pt tolerated well.  No evidence for foreign body.    She is aware this will likely recur over time.  Consider foot pad over-the-counter to reduce friction over this area  No orders of the defined types were placed in this encounter.   Follow-up: No follow-ups on file.    Carolann Littler, MD

## 2020-03-05 ENCOUNTER — Encounter: Payer: Self-pay | Admitting: Family Medicine

## 2020-03-05 ENCOUNTER — Telehealth (INDEPENDENT_AMBULATORY_CARE_PROVIDER_SITE_OTHER): Payer: BC Managed Care – PPO | Admitting: Family Medicine

## 2020-03-05 ENCOUNTER — Other Ambulatory Visit: Payer: Self-pay

## 2020-03-05 DIAGNOSIS — F411 Generalized anxiety disorder: Secondary | ICD-10-CM

## 2020-03-05 MED ORDER — BUSPIRONE HCL 7.5 MG PO TABS: 7.5000 mg | ORAL_TABLET | Freq: Two times a day (BID) | ORAL | 1 refills | Status: DC

## 2020-03-05 NOTE — Progress Notes (Unsigned)
Error

## 2020-03-05 NOTE — Progress Notes (Signed)
Virtual Visit via Video Note  I connected with Michele Gilbert  on 03/05/20 at  6:20 PM EDT by a video enabled telemedicine application and verified that I am speaking with the correct person using two identifiers.  Location patient: home Location provider:work or home office Persons participating in the virtual visit: patient, provider  I discussed the limitations of evaluation and management by telemedicine and the availability of in person appointments. The patient expressed understanding and agreed to proceed.   HPI:  Acute telemedicine visit for Anxiety/Stress: -she has a lot of anxiety about taking medications but thinks want to start medication -anxiety and stress worse over the last year, moved, kids in school, she is working more Games developer, issues with inlaws -worries all the time about everything, interferes with sleep and wears her out -denies regular or severe depression, SI, thoughts of harm, manic symptoms -did not tolerate SSRIs in the past -did tolerate buspirone in the past and would like to try restarting a low dose -feels like does not have time for counseling right now -Pertinent past medical history: anxiety, anemia, history of irr bleeding - seeing gynecologist, on OCP but bleeding daily the last month -Pertinent medication allergies:nkda - but did not tolerate SSRIs   ROS: See pertinent positives and negatives per HPI.  Past Medical History:  Diagnosis Date  . Constipation   . Dysplastic nevus 09/22/2016   - R upper lateral arm (deltoid) -S/p biopsy 09/2016 - moderate atypia w/ recs from pathology for conservative re-excision if recurrent  pigmentation at site  . Headache(784.0)    frequent  . Mastitis   . Migraine   . UTI (urinary tract infection)     Past Surgical History:  Procedure Laterality Date  . NO PAST SURGERIES       Current Outpatient Medications:  .  Bisacodyl (DULCOLAX PO), Take by mouth as needed., Disp: , Rfl:  .  busPIRone (BUSPAR) 7.5 MG  tablet, Take 1 tablet (7.5 mg total) by mouth 2 (two) times daily., Disp: 60 tablet, Rfl: 1 .  VITAMIN D PO, Take by mouth., Disp: , Rfl:   EXAM:  VITALS per patient if applicable:  GENERAL: alert, oriented, appears well and in no acute distress  HEENT: atraumatic, conjunttiva clear, no obvious abnormalities on inspection of external nose and ears  NECK: normal movements of the head and neck  LUNGS: on inspection no signs of respiratory distress, breathing rate appears normal, no obvious gross SOB, gasping or wheezing  CV: no obvious cyanosis  MS: moves all visible extremities without noticeable abnormality  PSYCH/NEURO: pleasant and cooperative, no obvious depression or anxiety, speech and thought processing grossly intact  ASSESSMENT AND PLAN:  Discussed the following assessment and plan:  GAD (generalized anxiety disorder)  -we discussed possible serious and likely etiologies, options for evaluation and workup, limitations of telemedicine visit vs in person visit, treatment, treatment risks and precautions. Pt prefers to treat via telemedicine empirically rather than in person at this moment.  Discussed various options for the treatment of generalized anxiety disorder.  Also discussed other potential diagnoses, but it seems like anxiety is her main issue.  Advised cognitive behavioral therapy, but she does not feel that she has the time to do this currently.  She would like to start a medication.  Discussed options and she prefers to try buspirone as feels she tolerated this in the past.  She is quite anxious about side effects.  Discussed.  Opted to start with a very low dose  with close follow-up with her primary care doctor in 3 to 4 weeks advised. Advised that she contact her gynecologist tomorrow for an appointment regarding her bleeding and history of anemia.  Advise she likely will need blood work with her gynecologist for this.  Scheduled follow up with PCP offered:  Sent  message to schedulers to assist and advised patient to contact PCP office to schedule if does not receive call back in next 24 hours.  Advised to seek prompt in person care if worsening, new symptoms arise, or if is not improving with treatment. Discussed options for inperson care if PCP office not available. Did let this patient know that I only do telemedicine on Tuesdays and Thursdays for [facility]. Advised to schedule follow up visit with PCP or [facility] if any further questions or concerns to avoid delays in care.   I discussed the assessment and treatment plan with the patient.  Spent over 40 minutes on this encounter.  The patient was provided an opportunity to ask questions and all were answered. The patient agreed with the plan and demonstrated an understanding of the instructions.     [name], DO

## 2020-03-05 NOTE — Patient Instructions (Signed)
-  I sent the medication(s) we discussed to your pharmacy: Meds ordered this encounter  Medications  . busPIRone (BUSPAR) 7.5 MG tablet    Sig: Take 1 tablet (7.5 mg total) by mouth 2 (two) times daily.    Dispense:  60 tablet    Refill:  1   Please try to get some counseling if you can.  Please follow-up with your gynecologist as soon as possible about the bleeding and to check your blood counts.  If you cannot get in with your gynecologist, please schedule an appoint with your oncologist or primary care doctor soon as possible about this.  Follow-up with Dr. Ethlyn Gallery in about 3 to 4 weeks regarding the anxiety.  I hope you are feeling better soon!  Seek in person care promptly if your symptoms worsen, new concerns arise or you are not improving with treatment.  It was nice to meet you today. I help [facility] out with telemedicine visits on Tuesdays and Thursdays and am available for visits on those days. If you have any concerns or questions following this visit please schedule a follow up visit with your Primary Care doctor or seek care at a local urgent care clinic to avoid delays in care.

## 2020-03-08 DIAGNOSIS — L661 Lichen planopilaris: Secondary | ICD-10-CM | POA: Diagnosis not present

## 2020-03-08 DIAGNOSIS — L700 Acne vulgaris: Secondary | ICD-10-CM | POA: Diagnosis not present

## 2020-03-11 DIAGNOSIS — N946 Dysmenorrhea, unspecified: Secondary | ICD-10-CM | POA: Diagnosis not present

## 2020-03-11 DIAGNOSIS — Z862 Personal history of diseases of the blood and blood-forming organs and certain disorders involving the immune mechanism: Secondary | ICD-10-CM | POA: Diagnosis not present

## 2020-04-09 DIAGNOSIS — F4010 Social phobia, unspecified: Secondary | ICD-10-CM | POA: Diagnosis not present

## 2020-04-17 DIAGNOSIS — F4010 Social phobia, unspecified: Secondary | ICD-10-CM | POA: Diagnosis not present

## 2020-04-19 DIAGNOSIS — L661 Lichen planopilaris: Secondary | ICD-10-CM | POA: Diagnosis not present

## 2020-04-20 DIAGNOSIS — J029 Acute pharyngitis, unspecified: Secondary | ICD-10-CM | POA: Diagnosis not present

## 2020-04-20 DIAGNOSIS — Z20822 Contact with and (suspected) exposure to covid-19: Secondary | ICD-10-CM | POA: Diagnosis not present

## 2020-04-22 DIAGNOSIS — F4010 Social phobia, unspecified: Secondary | ICD-10-CM | POA: Diagnosis not present

## 2020-05-06 ENCOUNTER — Telehealth: Payer: Self-pay | Admitting: Hematology and Oncology

## 2020-05-06 NOTE — Telephone Encounter (Signed)
Left message to reschedule appointments per 1/3 schedule message. Gave option to call back to reschedule.

## 2020-05-08 ENCOUNTER — Inpatient Hospital Stay: Payer: BC Managed Care – PPO | Attending: Family Medicine

## 2020-05-09 NOTE — Progress Notes (Incomplete)
   Patient Care Team: Wynn Banker, MD as PCP - General (Family Medicine) Genia Del, MD as Attending Physician (Obstetrics and Gynecology)  DIAGNOSIS: No diagnosis found.  CHIEF COMPLIANT: Follow-up ofiron deficiency anemia  INTERVAL HISTORY: Michele Gilbert is a 40 y.o. with above-mentioned history of iron deficiency anemiatreated with IV iron therapy.She presents to the clinic todayforfollow-up.   ALLERGIES:  is allergic to latex.  MEDICATIONS:  Current Outpatient Medications  Medication Sig Dispense Refill  . Bisacodyl (DULCOLAX PO) Take by mouth as needed.    . busPIRone (BUSPAR) 7.5 MG tablet Take 1 tablet (7.5 mg total) by mouth 2 (two) times daily. 60 tablet 1  . VITAMIN D PO Take by mouth.     No current facility-administered medications for this visit.    PHYSICAL EXAMINATION: ECOG PERFORMANCE STATUS: {CHL ONC ECOG [phone]}  There were no vitals filed for this visit. There were no vitals filed for this visit.  LABORATORY DATA:  I have reviewed the data as listed CMP Latest Ref Rng & Units 04/05/2019 09/23/2018 09/17/2016  Glucose 70 - 99 mg/dL 84 82 75  BUN 6 - 23 mg/dL 16 15 13   Creatinine 0.40 - 1.20 mg/dL 8.18 5.63  Sodium 135 - 145 mEq/L 140 140 140  Potassium 3.5 - 5.1 mEq/L 4.6 4.1 4.6  Chloride 96 - 112 mEq/L 105 103 103  CO2 19 - 32 mEq/L 27 28 29   Calcium 8.4 - 10.5 mg/dL 9.6 9.7 9.6  Total Protein 6.0 - 8.3 g/dL 7.4 7.7 -  Total Bilirubin 0.2 - 1.2 mg/dL 0.2 0.3 -  Alkaline Phos 39 - 117 U/L 68 81 -  AST 0 - 37 U/L 17 14 -  ALT 0 - 35 U/L 17 11 -    Lab Results  Component Value Date   WBC 8.0 10/25/2019   HGB 14.0 10/25/2019   HCT 41.6 10/25/2019   MCV 87.9 10/25/2019   PLT 296 10/25/2019   NEUTROABS 4.4 10/25/2019    ASSESSMENT & PLAN:  No problem-specific Assessment & Plan notes found for this encounter.    No orders of the defined types were placed in this encounter.  The patient has a good  understanding of the overall plan. she agrees with it. she will call with any problems that may develop before the next visit here.  Total time spent: *** mins including face to face time and time spent for planning, charting and coordination of care  10/27/2019, MD 05/09/2020  I, Serena Croissant Dorshimer, am acting as scribe for Dr. 07/07/2020.  {insert scribe attestation}

## 2020-05-09 NOTE — Assessment & Plan Note (Deleted)
Lab review 04/05/2019: Hemoglobin 12.9, MCV 86.1, ferritin 8.6, iron saturation 13.5 Patient was unable to tolerate oral iron. IV iron: January 2021  Lab review:  07/25/2019: Iron saturation 27%, ferritin 92 (improved from 8.6), hemoglobin 13.3, MCV 89.7 10/25/2019: Hemoglobin 14, MCV 87.9, ferritin 85, iron saturation 29%

## 2020-05-10 ENCOUNTER — Inpatient Hospital Stay: Payer: BC Managed Care – PPO | Admitting: Hematology and Oncology

## 2020-05-10 DIAGNOSIS — D509 Iron deficiency anemia, unspecified: Secondary | ICD-10-CM

## 2020-05-17 DIAGNOSIS — F4010 Social phobia, unspecified: Secondary | ICD-10-CM | POA: Diagnosis not present

## 2020-05-22 ENCOUNTER — Ambulatory Visit: Payer: BC Managed Care – PPO | Admitting: Family Medicine

## 2020-05-31 DIAGNOSIS — L811 Chloasma: Secondary | ICD-10-CM | POA: Diagnosis not present

## 2020-05-31 DIAGNOSIS — F4010 Social phobia, unspecified: Secondary | ICD-10-CM | POA: Diagnosis not present

## 2020-05-31 DIAGNOSIS — L661 Lichen planopilaris: Secondary | ICD-10-CM | POA: Diagnosis not present

## 2020-06-12 DIAGNOSIS — F4010 Social phobia, unspecified: Secondary | ICD-10-CM | POA: Diagnosis not present

## 2020-06-14 ENCOUNTER — Encounter: Payer: Self-pay | Admitting: Family Medicine

## 2020-06-14 ENCOUNTER — Other Ambulatory Visit: Payer: Self-pay

## 2020-06-14 ENCOUNTER — Ambulatory Visit (INDEPENDENT_AMBULATORY_CARE_PROVIDER_SITE_OTHER): Payer: BC Managed Care – PPO | Admitting: Family Medicine

## 2020-06-14 VITALS — BP 100/62 | HR 74 | Temp 98.4°F | Ht 63.0 in | Wt 135.6 lb

## 2020-06-14 DIAGNOSIS — F411 Generalized anxiety disorder: Secondary | ICD-10-CM

## 2020-06-14 MED ORDER — BUSPIRONE HCL 7.5 MG PO TABS: 7.5000 mg | ORAL_TABLET | Freq: Two times a day (BID) | ORAL | 2 refills | Status: DC

## 2020-06-14 NOTE — Progress Notes (Signed)
Michele Gilbert DOB: 12-20-1980 Encounter date: 06/14/2020  This is a 40 y.o. female who presents with Chief Complaint  Patient presents with  . Follow-up    History of present illness: One of issues is forgetting, also anxious, feels moody.   She did start buspar which she feels she tolerated better than other medications. Couldn't sleep on other medications she tried. She is taking the buspar once a day. Doesn't love taking medications. Specific situations get her more anxious than others. Has uncovered some explanations for anxiety and things that happened in past which she feels explained what she was feeling. (tried zoloft and lexapro - one made her feel crazy; other she couldn't sleep with) and has been talking with therapist regularly. She does feel better with taking the buspar. She does feel like it helps to calm her.   She is working with therapist as well. Feels more anxiety than depressed mood.   Days during cycle are harder - very irritable, upset easily. Feels this way for most of the week of her cycle.  Usually tired by 1:30 in afternoon. Does better with sleep since starting working out. She does feel rested in the morning. Some days problems are so big in head, can't sleep, then day isn't as good.    Allergies  Allergen Reactions  . Latex Dermatitis   Current Meds  Medication Sig  . Bisacodyl (DULCOLAX PO) Take by mouth as needed.  . busPIRone (BUSPAR) 7.5 MG tablet Take 1 tablet (7.5 mg total) by mouth 2 (two) times daily. (Patient taking differently: Take 7.5 mg by mouth daily.)  . tretinoin (RETIN-A) 0.025 % cream Apply 1 application topically at bedtime.    Review of Systems  Constitutional: Negative for chills, fatigue and fever.  Respiratory: Negative for cough, chest tightness, shortness of breath and wheezing.   Cardiovascular: Negative for chest pain, palpitations and leg swelling.  Psychiatric/Behavioral: Positive for sleep disturbance (sometimes  related to anxiety). The patient is nervous/anxious.     Objective:  BP 100/62 (BP Location: Left Arm, Patient Position: Sitting, Cuff Size: Normal)   Pulse 74   Temp 98.4 F (36.9 C) (Oral)   Ht 5\' 3"  (1.6 m)   Wt 135 lb 9.6 oz (61.5 kg)   LMP 06/12/2020 (Exact Date)   SpO2 98%   BMI 24.02 kg/m   Weight: 135 lb 9.6 oz (61.5 kg)   BP Readings from Last 3 Encounters:  06/14/20 100/62  02/07/20 118/80  11/07/19 113/71   Wt Readings from Last 3 Encounters:  06/14/20 135 lb 9.6 oz (61.5 kg)  02/07/20 133 lb (60.3 kg)  11/07/19 139 lb 12.8 oz (63.4 kg)    Physical Exam Constitutional:      General: She is not in acute distress.    Appearance: She is well-developed.  Cardiovascular:     Rate and Rhythm: Normal rate and regular rhythm.     Heart sounds: Normal heart sounds. No murmur heard. No friction rub.  Pulmonary:     Effort: Pulmonary effort is normal. No respiratory distress.     Breath sounds: Normal breath sounds. No wheezing or rales.  Musculoskeletal:     Right lower leg: No edema.     Left lower leg: No edema.  Neurological:     Mental Status: She is alert and oriented to person, place, and time.  Psychiatric:        Behavior: Behavior normal.    GAD 7 : Generalized Anxiety Score 06/14/2020 10/07/2018  Nervous, Anxious, on Edge 1 1  Control/stop worrying 2 2  Worry too much - different things 3 2  Trouble relaxing 1 2  Restless 0 1  Easily annoyed or irritable 1 3  Afraid - awful might happen 0 2  Total GAD 7 Score 8 13  Anxiety Difficulty Somewhat difficult Somewhat difficult    Flowsheet Row Office Visit from 06/14/2020 in [facility]  PHQ-9 Total Score 2       Assessment/Plan  1. GAD (generalized anxiety disorder) She is doing better on buspar. We discussed increasing to BID and even considering increase to 2 tabs BID during week of pre-menstrual. She is going to try this and will continue to meet with therapist. She had  bloodwork done through gyn so we are requesting these labs today and will consider additional bloodwork to make sure more medical cause for increased anxiety (ie hyperthyroid).  - busPIRone (BUSPAR) 7.5 MG tablet; Take 1 tablet (7.5 mg total) by mouth 2 (two) times daily.  Dispense: 60 tablet; Refill: 2    Return for pending records from gynecology.     [name], MD

## 2020-06-14 NOTE — Patient Instructions (Addendum)
Try increasing the buspar to 7.5mg  twice daily. Let me know how this works for anxiety/focus in about 2 weeks.  Try increasing the buspar to 2 tabs twice daily during menstrual week (15mg  twice daily).  I will call you to set up bloodwork once I get the obgyn records. If you haven't heard from me in 2 weeks just remind me about these records when you message me with update from above.

## 2020-06-28 DIAGNOSIS — F4010 Social phobia, unspecified: Secondary | ICD-10-CM | POA: Diagnosis not present

## 2020-07-09 ENCOUNTER — Encounter: Payer: Self-pay | Admitting: Family Medicine

## 2020-07-12 DIAGNOSIS — F4010 Social phobia, unspecified: Secondary | ICD-10-CM | POA: Diagnosis not present

## 2020-07-22 ENCOUNTER — Encounter: Payer: Self-pay | Admitting: Hematology and Oncology

## 2020-07-31 DIAGNOSIS — F4010 Social phobia, unspecified: Secondary | ICD-10-CM | POA: Diagnosis not present

## 2020-08-08 ENCOUNTER — Other Ambulatory Visit: Payer: Self-pay

## 2020-08-09 ENCOUNTER — Ambulatory Visit (INDEPENDENT_AMBULATORY_CARE_PROVIDER_SITE_OTHER): Payer: BC Managed Care – PPO | Admitting: Family Medicine

## 2020-08-09 ENCOUNTER — Encounter: Payer: Self-pay | Admitting: Family Medicine

## 2020-08-09 VITALS — BP 108/82 | HR 61 | Temp 97.7°F | Wt 136.8 lb

## 2020-08-09 DIAGNOSIS — L84 Corns and callosities: Secondary | ICD-10-CM

## 2020-08-09 NOTE — Patient Instructions (Signed)
Corns and Calluses Corns are small areas of thickened skin that form on the top, sides, or tip of a toe. Corns have a cone-shaped core with a point that can press on a nerve below. This causes pain. Calluses are areas of thickened skin that can form anywhere on the body, including the hands, fingers, palms, soles of the feet, and heels. Calluses are usually larger than corns. What are the causes? Corns and calluses are caused by rubbing (friction) or pressure, such as from shoes that are too tight or do not fit properly. What increases the risk? Corns are more likely to develop in people who have misshapen toes (toe deformities), such as hammer toes. Calluses can form with friction to any area of the skin. They are more likely to develop in people who:  Work with their hands.  Wear shoes that fit poorly, are too tight, or are high-heeled.  Have toe deformities. What are the signs or symptoms? Symptoms of a corn or callus include:  A hard growth on the skin.  Pain or tenderness under the skin.  Redness and swelling.  Increased discomfort while wearing tight-fitting shoes, if your feet are affected. If a corn or callus becomes infected, symptoms may include:  Redness and swelling that gets worse.  Pain.  Fluid, blood, or pus draining from the corn or callus.   How is this diagnosed? Corns and calluses may be diagnosed based on your symptoms, your medical history, and a physical exam. How is this treated? Treatment for corns and calluses may include:  Removing the cause of the friction or pressure. This may involve: ? Changing your shoes. ? Wearing shoe inserts (orthotics) or other protective layers in your shoes, such as a corn pad. ? Wearing gloves.  Applying medicine to the skin (topical medicine) to help soften skin in the hardened, thickened areas.  Removing layers of dead skin with a file to reduce the size of the corn or callus.  Removing the corn or callus with a  scalpel or laser.  Taking antibiotic medicines, if your corn or callus is infected.  Having surgery, if a toe deformity is the cause. Follow these instructions at home:  Take over-the-counter and prescription medicines only as told by your health care provider.  If you were prescribed an antibiotic medicine, take it as told by your health care provider. Do not stop taking it even if your condition improves.  Wear shoes that fit well. Avoid wearing high-heeled shoes and shoes that are too tight or too loose.  Wear any padding, protective layers, gloves, or orthotics as told by your health care provider.  Soak your hands or feet. Then use a file or pumice stone to soften your corn or callus. Do this as told by your health care provider.  Check your corn or callus every day for signs of infection.   Contact a health care provider if:  Your symptoms do not improve with treatment.  You have redness or swelling that gets worse.  Your corn or callus becomes painful.  You have fluid, blood, or pus coming from your corn or callus.  You have new symptoms. Get help right away if:  You develop severe pain with redness. Summary  Corns are small areas of thickened skin that form on the top, sides, or tip of a toe. These can be painful.  Calluses are areas of thickened skin that can form anywhere on the body, including the hands, fingers, palms, and soles of the   feet. Calluses are usually larger than corns.  Corns and calluses are caused by rubbing (friction) or pressure, such as from shoes that are too tight or do not fit properly.  Treatment may include wearing padding, protective layers, gloves, or orthotics as told by your health care provider. This information is not intended to replace advice given to you by your health care provider. Make sure you discuss any questions you have with your health care provider. Document Revised: 08/17/2019 Document Reviewed: 08/17/2019 Elsevier  Patient Education  2021 [employer]  

## 2020-08-09 NOTE — Progress Notes (Signed)
Subjective:    Patient ID: Michele Gilbert, female    DOB: 1980-08-26, 40 y.o.   MRN: [id number]  No chief complaint on file.   HPI Patient was seen today for ongoing concern.  Patient with a hard, painful callus on bottom of left foot x a while.  Tried OTC Dr. Felicie Morn medicated callus remover which seem to make the area worse.  Patient notes peeling of skin surrounding callus.  Denies drainage or erythema.  In the past patient had the area pared.  Pt wears mostly tennis shoes daily.    Patient is from [place] and work as a Merchant navy officer.  Past Medical History:  Diagnosis Date  . Constipation   . Dysplastic nevus 09/22/2016   - R upper lateral arm (deltoid) -S/p biopsy 09/2016 - moderate atypia w/ recs from pathology for conservative re-excision if recurrent  pigmentation at site  . Headache(784.0)    frequent  . Mastitis   . Migraine   . UTI (urinary tract infection)     Allergies  Allergen Reactions  . Latex Dermatitis    ROS General: Denies fever, chills, night sweats, changes in weight, changes in appetite HEENT: Denies headaches, ear pain, changes in vision, rhinorrhea, sore throat CV: Denies CP, palpitations, SOB, orthopnea Pulm: Denies SOB, cough, wheezing GI: Denies abdominal pain, nausea, vomiting, diarrhea, constipation GU: Denies dysuria, hematuria, frequency, vaginal discharge Msk: Denies muscle cramps, joint pains Neuro: Denies weakness, numbness, tingling Skin: Denies rashes, bruising  +callous of L foot Psych: Denies depression, anxiety, hallucinations     Objective:    Blood pressure 108/82, pulse 61, temperature 97.7 F (36.5 C), temperature source Oral, weight 136 lb 12.8 oz (62.1 kg), SpO2 98 %, unknown if currently breastfeeding.  Gen. Pleasant, well-nourished, in no distress, normal affect   HEENT: /AT, face symmetric, conjunctiva clear, no scleral icterus, PERRLA, EOMI, nares patent without drainage Lungs: no accessory muscle use, CTAB,  no wheezes or rales Cardiovascular: RRR, no peripheral edema Neuro:  A&Ox3, CN II-XII intact, normal gait Skin:  Warm, dry, intact, without rash.  A nickel sized area of hypopigmented skin with a plantar wart in the center.  Distal edge of area with peeling, softer skin, hypertrophic skin proximal.  Area pared with 10 blade.   Wt Readings from Last 3 Encounters:  08/09/20 136 lb 12.8 oz (62.1 kg)  06/14/20 135 lb 9.6 oz (61.5 kg)  02/07/20 133 lb (60.3 kg)    Lab Results  Component Value Date   WBC 8.0 10/25/2019   HGB 14.0 10/25/2019   HCT 41.6 10/25/2019   PLT 296 10/25/2019   GLUCOSE 84 04/05/2019   CHOL 168 09/23/2018   TRIG 178.0 (H) 09/23/2018   HDL 33.60 (L) 09/23/2018   LDLCALC 99 09/23/2018   ALT 17 04/05/2019   AST 17 04/05/2019   NA 140 04/05/2019   K 4.6 04/05/2019   CL 105 04/05/2019   CREATININE 0.60 04/05/2019   BUN 16 04/05/2019   CO2 27 04/05/2019   TSH 2.67 09/23/2018   HGBA1C 5.5 09/23/2018    Assessment/Plan:  Plantar callus  -Consent obtained.  Callus with plantar wart pared.  Patient tolerated procedure well. -Discussed soaking callus and Epson salt and warm water to make soft. -Discussed wearing shoes that do not rub on the area and have enough room in the toe box -Due to use of medicated bandage skin likely to peel off in the few next days-weeks -Continue to monitor -For continued or worsening symptoms  consider follow-up with podiatry -Given handout  F/u as needed  Grier Mitts, MD

## 2020-08-16 DIAGNOSIS — F4010 Social phobia, unspecified: Secondary | ICD-10-CM | POA: Diagnosis not present

## 2020-09-06 DIAGNOSIS — F4010 Social phobia, unspecified: Secondary | ICD-10-CM | POA: Diagnosis not present

## 2020-09-16 DIAGNOSIS — F4010 Social phobia, unspecified: Secondary | ICD-10-CM | POA: Diagnosis not present

## 2020-09-27 DIAGNOSIS — F4010 Social phobia, unspecified: Secondary | ICD-10-CM | POA: Diagnosis not present

## 2020-10-05 ENCOUNTER — Other Ambulatory Visit: Payer: Self-pay | Admitting: Family Medicine

## 2020-10-05 DIAGNOSIS — F411 Generalized anxiety disorder: Secondary | ICD-10-CM

## 2020-10-07 DIAGNOSIS — F4010 Social phobia, unspecified: Secondary | ICD-10-CM | POA: Diagnosis not present

## 2020-10-18 DIAGNOSIS — F4010 Social phobia, unspecified: Secondary | ICD-10-CM | POA: Diagnosis not present

## 2020-10-21 ENCOUNTER — Other Ambulatory Visit: Payer: Self-pay

## 2020-10-21 ENCOUNTER — Encounter: Payer: Self-pay | Admitting: Family Medicine

## 2020-10-21 ENCOUNTER — Ambulatory Visit (INDEPENDENT_AMBULATORY_CARE_PROVIDER_SITE_OTHER): Payer: BC Managed Care – PPO | Admitting: Family Medicine

## 2020-10-21 VITALS — BP 100/70 | HR 61 | Temp 98.7°F | Ht 63.0 in | Wt 140.0 lb

## 2020-10-21 DIAGNOSIS — F419 Anxiety disorder, unspecified: Secondary | ICD-10-CM

## 2020-10-21 DIAGNOSIS — F909 Attention-deficit hyperactivity disorder, unspecified type: Secondary | ICD-10-CM

## 2020-10-21 MED ORDER — AMPHETAMINE-DEXTROAMPHETAMINE 5 MG PO TABS
5.0000 mg | ORAL_TABLET | Freq: Every day | ORAL | 0 refills | Status: DC
Start: 2020-10-21 — End: 2020-11-19

## 2020-10-21 NOTE — Progress Notes (Signed)
Michele Gilbert DOB: [birth year] Encounter date: 10/21/2020  This is a 40 y.o. female who presents with Chief Complaint  Patient presents with   Anxiety   ADHD     History of present illness: Last visit was 06/14/20. At that visit we discussed anxiety and she was getting some relief with buspar. She was working with therapist as well. With continued visits with therapist, they have been talking more about her adhd.   She does think that focus has been an issue for her for her whole life. Very overwhelmed in college with work, school, and was on [place] during that time. She didn't want to be taking medication forever back then, so worked on coping strategies to get her through some of the difficulties she was having with this. She doesn't really remember how she did on this; but feels it helped because she was able to complete school and keep working.   When talking with therapist- noted some of her tendencies may be more issue with adhd - like jumping in during conversation, interrupting. Therapist is feeling that adhd is underlying issue creating more anxiety.  Allergies  Allergen Reactions   Latex Dermatitis   Current Meds  Medication Sig   amphetamine-dextroamphetamine (ADDERALL) 5 MG tablet Take 1 tablet (5 mg total) by mouth daily.    Review of Systems  Constitutional:  Negative for chills, fatigue and fever.  Respiratory:  Negative for cough, chest tightness, shortness of breath and wheezing.   Cardiovascular:  Negative for chest pain, palpitations and leg swelling.  Psychiatric/Behavioral:  Positive for agitation, decreased concentration and sleep disturbance. The patient is nervous/anxious.    Objective:  BP 100/70   Pulse 61   Temp 98.7 F (37.1 C) (Oral)   Ht 5\' 3"  (1.6 m)   Wt 140 lb (63.5 kg)   SpO2 98%   BMI 24.80 kg/m   Weight: 140 lb (63.5 kg)   BP Readings from Last 3 Encounters:  10/21/20 100/70  08/09/20 108/82  06/14/20 100/62   Wt Readings from  Last 3 Encounters:  10/21/20 140 lb (63.5 kg)  08/09/20 136 lb 12.8 oz (62.1 kg)  06/14/20 135 lb 9.6 oz (61.5 kg)    Physical Exam Constitutional:      Appearance: Normal appearance.  HENT:     Head: Normocephalic and atraumatic.  Pulmonary:     Effort: Pulmonary effort is normal.  Neurological:     Mental Status: She is alert.  Psychiatric:        Attention and Perception: Attention normal.        Mood and Affect: Mood is anxious.        Speech: Speech normal.        Behavior: Behavior normal.        Cognition and Memory: Cognition normal.    Assessment/Plan  1. Anxiety She is still taking the BuSpar and feels that this does help her overall anxiety.  We discussed that treating underlying attention issues should also help with anxiety control.  2. Attention deficit hyperactivity disorder (ADHD), unspecified ADHD type She has had ADHD symptoms since she was young.  She believes she did well previously on the Adderall and is willing to try this at low dose.  She is worried about it affecting sleep, so we have elected to a short acting rather than long-acting version.  I have asked her to update me in 2 weeks time to MyChart to let me know how she is doing with medication.  ADHD testing and evaluation.  I have given her numbers for [place] [facility] and [facility] so that she can call to set up testing.  We discussed that wait time for this can be quite long, so since she had previously been diagnosed and treated for ADHD, I think it is reasonable for her to try low dose adderall in the meanwhile.  Return for mychart message in 2 weeks to let me know how you are doing with meds. Michele Rough, MD

## 2020-10-21 NOTE — Patient Instructions (Addendum)
*  [facility]: [phone] - call for ADHD testing.   *[facility] (don't take insurance)

## 2020-10-29 DIAGNOSIS — F4010 Social phobia, unspecified: Secondary | ICD-10-CM | POA: Diagnosis not present

## 2020-11-12 DIAGNOSIS — F4010 Social phobia, unspecified: Secondary | ICD-10-CM | POA: Diagnosis not present

## 2020-11-13 ENCOUNTER — Encounter: Payer: Self-pay | Admitting: Family Medicine

## 2020-11-19 MED ORDER — AMPHETAMINE-DEXTROAMPHETAMINE 10 MG PO TABS
10.0000 mg | ORAL_TABLET | Freq: Every day | ORAL | 0 refills | Status: DC
Start: 2020-11-19 — End: 2021-01-21

## 2020-11-25 ENCOUNTER — Encounter: Payer: Self-pay | Admitting: Family Medicine

## 2020-12-02 DIAGNOSIS — L700 Acne vulgaris: Secondary | ICD-10-CM | POA: Diagnosis not present

## 2020-12-02 DIAGNOSIS — L659 Nonscarring hair loss, unspecified: Secondary | ICD-10-CM | POA: Diagnosis not present

## 2020-12-03 DIAGNOSIS — F4010 Social phobia, unspecified: Secondary | ICD-10-CM | POA: Diagnosis not present

## 2020-12-27 DIAGNOSIS — F4010 Social phobia, unspecified: Secondary | ICD-10-CM | POA: Diagnosis not present

## 2021-01-08 DIAGNOSIS — F4010 Social phobia, unspecified: Secondary | ICD-10-CM | POA: Diagnosis not present

## 2021-01-10 ENCOUNTER — Ambulatory Visit (HOSPITAL_COMMUNITY)
Admission: EM | Admit: 2021-01-10 | Discharge: 2021-01-10 | Disposition: A | Discharge status: Home / Self Care | Payer: BC Managed Care – PPO

## 2021-01-10 ENCOUNTER — Telehealth: Payer: BC Managed Care – PPO

## 2021-01-10 ENCOUNTER — Other Ambulatory Visit: Payer: Self-pay

## 2021-01-10 DIAGNOSIS — U071 COVID-19: Secondary | ICD-10-CM | POA: Diagnosis not present

## 2021-01-10 DIAGNOSIS — Z5321 Procedure and treatment not carried out due to patient leaving prior to being seen by health care provider: Secondary | ICD-10-CM

## 2021-01-10 NOTE — ED Notes (Signed)
No answer by phone, no message box available. No answer in waiting area.

## 2021-01-10 NOTE — ED Notes (Signed)
No answer x2 

## 2021-01-18 ENCOUNTER — Other Ambulatory Visit: Payer: Self-pay | Admitting: Family Medicine

## 2021-01-21 ENCOUNTER — Encounter: Payer: Self-pay | Admitting: Family Medicine

## 2021-01-21 ENCOUNTER — Other Ambulatory Visit: Payer: Self-pay | Admitting: Family Medicine

## 2021-01-21 MED ORDER — AMPHETAMINE-DEXTROAMPHETAMINE 10 MG PO TABS: 10.0000 mg | ORAL_TABLET | Freq: Every day | ORAL | 0 refills | Status: DC

## 2021-01-22 DIAGNOSIS — F4010 Social phobia, unspecified: Secondary | ICD-10-CM | POA: Diagnosis not present

## 2021-02-12 DIAGNOSIS — F4010 Social phobia, unspecified: Secondary | ICD-10-CM | POA: Diagnosis not present

## 2021-02-26 DIAGNOSIS — F4010 Social phobia, unspecified: Secondary | ICD-10-CM | POA: Diagnosis not present

## 2021-03-03 ENCOUNTER — Other Ambulatory Visit: Payer: Self-pay | Admitting: Family Medicine

## 2021-03-04 MED ORDER — AMPHETAMINE-DEXTROAMPHETAMINE 10 MG PO TABS: 10.0000 mg | ORAL_TABLET | Freq: Every day | ORAL | 0 refills | Status: DC

## 2021-03-05 ENCOUNTER — Encounter: Payer: Self-pay | Admitting: Family Medicine

## 2021-03-20 DIAGNOSIS — F4010 Social phobia, unspecified: Secondary | ICD-10-CM | POA: Diagnosis not present

## 2021-03-26 DIAGNOSIS — Z01419 Encounter for gynecological examination (general) (routine) without abnormal findings: Secondary | ICD-10-CM | POA: Diagnosis not present

## 2021-03-26 DIAGNOSIS — Z124 Encounter for screening for malignant neoplasm of cervix: Secondary | ICD-10-CM | POA: Diagnosis not present

## 2021-03-26 DIAGNOSIS — Z113 Encounter for screening for infections with a predominantly sexual mode of transmission: Secondary | ICD-10-CM | POA: Diagnosis not present

## 2021-03-26 DIAGNOSIS — Z1231 Encounter for screening mammogram for malignant neoplasm of breast: Secondary | ICD-10-CM | POA: Diagnosis not present

## 2021-03-26 DIAGNOSIS — Z6824 Body mass index (BMI) 24.0-24.9, adult: Secondary | ICD-10-CM | POA: Diagnosis not present

## 2021-04-01 ENCOUNTER — Other Ambulatory Visit: Payer: Self-pay | Admitting: Obstetrics and Gynecology

## 2021-04-01 DIAGNOSIS — R928 Other abnormal and inconclusive findings on diagnostic imaging of breast: Secondary | ICD-10-CM

## 2021-04-09 DIAGNOSIS — F4010 Social phobia, unspecified: Secondary | ICD-10-CM | POA: Diagnosis not present

## 2021-04-14 ENCOUNTER — Ambulatory Visit
Admission: RE | Admit: 2021-04-14 | Discharge: 2021-04-14 | Disposition: A | Discharge status: Home / Self Care | Payer: BC Managed Care – PPO | Source: Ambulatory Visit | Attending: Obstetrics and Gynecology | Admitting: Obstetrics and Gynecology

## 2021-04-14 ENCOUNTER — Other Ambulatory Visit: Payer: Self-pay | Admitting: Obstetrics and Gynecology

## 2021-04-14 DIAGNOSIS — R928 Other abnormal and inconclusive findings on diagnostic imaging of breast: Secondary | ICD-10-CM

## 2021-04-14 DIAGNOSIS — N6320 Unspecified lump in the left breast, unspecified quadrant: Secondary | ICD-10-CM

## 2021-04-14 DIAGNOSIS — R922 Inconclusive mammogram: Secondary | ICD-10-CM | POA: Diagnosis not present

## 2021-04-15 ENCOUNTER — Other Ambulatory Visit: Payer: Self-pay | Admitting: Internal Medicine

## 2021-04-15 MED ORDER — AMPHETAMINE-DEXTROAMPHETAMINE 10 MG PO TABS: 10.0000 mg | ORAL_TABLET | Freq: Every day | ORAL | 0 refills | Status: DC

## 2021-04-23 DIAGNOSIS — F4010 Social phobia, unspecified: Secondary | ICD-10-CM | POA: Diagnosis not present

## 2021-04-24 ENCOUNTER — Ambulatory Visit
Admission: RE | Admit: 2021-04-24 | Discharge: 2021-04-24 | Disposition: A | Discharge status: Home / Self Care | Payer: BC Managed Care – PPO | Source: Ambulatory Visit | Attending: Obstetrics and Gynecology | Admitting: Obstetrics and Gynecology

## 2021-04-24 DIAGNOSIS — N6320 Unspecified lump in the left breast, unspecified quadrant: Secondary | ICD-10-CM

## 2021-04-24 DIAGNOSIS — N6323 Unspecified lump in the left breast, lower outer quadrant: Secondary | ICD-10-CM | POA: Diagnosis not present

## 2021-04-24 DIAGNOSIS — D242 Benign neoplasm of left breast: Secondary | ICD-10-CM | POA: Diagnosis not present

## 2021-04-29 ENCOUNTER — Other Ambulatory Visit: Payer: BC Managed Care – PPO

## 2021-05-06 ENCOUNTER — Other Ambulatory Visit: Payer: Self-pay | Admitting: Family Medicine

## 2021-05-07 MED ORDER — AMPHETAMINE-DEXTROAMPHETAMINE 10 MG PO TABS: 10.0000 mg | ORAL_TABLET | Freq: Every day | ORAL | 0 refills | Status: DC

## 2021-05-14 DIAGNOSIS — F4010 Social phobia, unspecified: Secondary | ICD-10-CM | POA: Diagnosis not present

## 2021-06-04 DIAGNOSIS — F4010 Social phobia, unspecified: Secondary | ICD-10-CM | POA: Diagnosis not present

## 2021-06-26 DIAGNOSIS — F4010 Social phobia, unspecified: Secondary | ICD-10-CM | POA: Diagnosis not present

## 2021-06-30 DIAGNOSIS — L649 Androgenic alopecia, unspecified: Secondary | ICD-10-CM | POA: Diagnosis not present

## 2021-06-30 DIAGNOSIS — L718 Other rosacea: Secondary | ICD-10-CM | POA: Diagnosis not present

## 2021-06-30 DIAGNOSIS — L661 Lichen planopilaris: Secondary | ICD-10-CM | POA: Diagnosis not present

## 2021-07-07 DIAGNOSIS — N6320 Unspecified lump in the left breast, unspecified quadrant: Secondary | ICD-10-CM | POA: Diagnosis not present

## 2021-07-07 DIAGNOSIS — N6310 Unspecified lump in the right breast, unspecified quadrant: Secondary | ICD-10-CM | POA: Diagnosis not present

## 2021-07-09 ENCOUNTER — Other Ambulatory Visit: Payer: Self-pay | Admitting: Obstetrics and Gynecology

## 2021-07-09 DIAGNOSIS — N6320 Unspecified lump in the left breast, unspecified quadrant: Secondary | ICD-10-CM

## 2021-07-09 DIAGNOSIS — F4010 Social phobia, unspecified: Secondary | ICD-10-CM | POA: Diagnosis not present

## 2021-07-12 ENCOUNTER — Encounter: Payer: Self-pay | Admitting: Family Medicine

## 2021-07-12 ENCOUNTER — Other Ambulatory Visit: Payer: Self-pay | Admitting: Family Medicine

## 2021-07-14 MED ORDER — AMPHETAMINE-DEXTROAMPHETAMINE 10 MG PO TABS: 10.0000 mg | ORAL_TABLET | Freq: Every day | ORAL | 0 refills | Status: DC

## 2021-07-21 ENCOUNTER — Other Ambulatory Visit: Payer: Self-pay

## 2021-07-21 ENCOUNTER — Ambulatory Visit
Admission: RE | Admit: 2021-07-21 | Discharge: 2021-07-21 | Disposition: A | Discharge status: Home / Self Care | Payer: BC Managed Care – PPO | Source: Ambulatory Visit | Attending: Obstetrics and Gynecology | Admitting: Obstetrics and Gynecology

## 2021-07-21 ENCOUNTER — Other Ambulatory Visit: Payer: Self-pay | Admitting: Obstetrics and Gynecology

## 2021-07-21 DIAGNOSIS — N6311 Unspecified lump in the right breast, upper outer quadrant: Secondary | ICD-10-CM | POA: Diagnosis not present

## 2021-07-21 DIAGNOSIS — N6320 Unspecified lump in the left breast, unspecified quadrant: Secondary | ICD-10-CM

## 2021-07-21 DIAGNOSIS — N6310 Unspecified lump in the right breast, unspecified quadrant: Secondary | ICD-10-CM

## 2021-07-21 DIAGNOSIS — R922 Inconclusive mammogram: Secondary | ICD-10-CM | POA: Diagnosis not present

## 2021-07-21 DIAGNOSIS — R928 Other abnormal and inconclusive findings on diagnostic imaging of breast: Secondary | ICD-10-CM | POA: Diagnosis not present

## 2021-07-23 DIAGNOSIS — F4010 Social phobia, unspecified: Secondary | ICD-10-CM | POA: Diagnosis not present

## 2021-08-27 ENCOUNTER — Encounter: Payer: Self-pay | Admitting: Hematology and Oncology

## 2021-08-27 DIAGNOSIS — F4010 Social phobia, unspecified: Secondary | ICD-10-CM | POA: Diagnosis not present

## 2021-09-17 DIAGNOSIS — F4010 Social phobia, unspecified: Secondary | ICD-10-CM | POA: Diagnosis not present

## 2021-09-19 ENCOUNTER — Encounter: Payer: Self-pay | Admitting: Family Medicine

## 2021-09-19 ENCOUNTER — Ambulatory Visit (INDEPENDENT_AMBULATORY_CARE_PROVIDER_SITE_OTHER): Payer: BC Managed Care – PPO | Admitting: Family Medicine

## 2021-09-19 VITALS — BP 118/70 | HR 95 | Temp 99.2°F | Resp 12 | Ht 63.0 in | Wt 129.5 lb

## 2021-09-19 DIAGNOSIS — K644 Residual hemorrhoidal skin tags: Secondary | ICD-10-CM

## 2021-09-19 DIAGNOSIS — K5909 Other constipation: Secondary | ICD-10-CM

## 2021-09-19 MED ORDER — LIDOCAINE 3 % EX CREA: 1.0000 "application " | TOPICAL_CREAM | Freq: Three times a day (TID) | CUTANEOUS | 0 refills | Status: AC | PRN

## 2021-09-19 MED ORDER — LINACLOTIDE 145 MCG PO CAPS: 145.0000 ug | ORAL_CAPSULE | Freq: Every day | ORAL | 0 refills | Status: AC

## 2021-09-19 MED ORDER — HYDROCORTISONE ACETATE 25 MG RE SUPP: 25.0000 mg | Freq: Two times a day (BID) | RECTAL | 0 refills | Status: DC

## 2021-09-19 NOTE — Progress Notes (Unsigned)
ACUTE VISIT Chief Complaint  Patient presents with   Hemorrhoids    X 3 days, painful   HPI: [name] is a 41 y.o. female, who is here today complaining of *** HPI  Review of Systems Rest see pertinent positives and negatives per HPI.  Current Outpatient Medications on File Prior to Visit  Medication Sig Dispense Refill   amphetamine-dextroamphetamine (ADDERALL) 10 MG tablet Take 1 tablet (10 mg total) by mouth daily with breakfast. 30 tablet 0   amphetamine-dextroamphetamine (ADDERALL) 10 MG tablet Take 1 tablet (10 mg total) by mouth daily with breakfast. 30 tablet 0   betamethasone dipropionate 0.05 % lotion Apply 1 application topically daily.     Bisacodyl (DULCOLAX PO) Take by mouth as needed.     doxycycline (VIBRAMYCIN) 50 MG capsule Take 50 mg by mouth daily.     finasteride (PROSCAR) 5 MG tablet Take 2.5 mg by mouth daily.     tretinoin (RETIN-A) 0.025 % cream Apply 1 application topically at bedtime.     No current facility-administered medications on file prior to visit.     Past Medical History:  Diagnosis Date   Constipation    Dysplastic nevus 09/22/2016   - R upper lateral arm (deltoid) -S/p biopsy 09/2016 - moderate atypia w/ recs from pathology for conservative re-excision if recurrent  pigmentation at site   Headache(784.0)    frequent   Mastitis    Migraine    UTI (urinary tract infection)    Allergies  Allergen Reactions   Latex Dermatitis    Social History   Socioeconomic History   Marital status: Married    Spouse name: Not on file   Number of children: Not on file   Years of education: Not on file   Highest education level: Not on file  Occupational History   Not on file  Tobacco Use   Smoking status: Never   Smokeless tobacco: Never  Substance and Sexual Activity   Alcohol use: Yes    Alcohol/week: 1.0 standard drink    Types: 1 Glasses of wine per week    Comment: occasional   Drug use: No   Sexual activity: Yes     Birth control/protection: None  Other Topics Concern   Not on file  Social History Narrative   Work or School:      Home Situation: lives with husband and 2 children (3 and 28 yo in 2018)   From [place]      Spiritual Beliefs:      Lifestyle:   Social Determinants of Health   Financial Resource Strain: Not on file  Food Insecurity: Not on file  Transportation Needs: Not on file  Physical Activity: Not on file  Stress: Not on file  Social Connections: Not on file    Vitals:   09/19/21 1559  BP: 118/70  Pulse: 95  Temp: 99.2 F (37.3 C)  SpO2: 99%   Body mass index is 22.94 kg/m.  Physical Exam Exam conducted with a chaperone present.  Constitutional:      General: She is not in acute distress.    Appearance: She is well-developed. She is not ill-appearing.  HENT:     Head: Atraumatic.  Eyes:     General: No scleral icterus.    Conjunctiva/sclera: Conjunctivae normal.  Cardiovascular:     Rate and Rhythm: Normal rate and regular rhythm.     Heart sounds: No murmur heard. Pulmonary:     Effort: Pulmonary effort  is normal. No respiratory distress.     Breath sounds: Normal breath sounds.  Abdominal:     General: Bowel sounds are normal. There is no distension.     Palpations: Abdomen is soft. There is no hepatomegaly or mass.     Tenderness: There is no abdominal tenderness.  Genitourinary:    Rectum: Tenderness and external hemorrhoid present.  Lymphadenopathy:     Cervical: No cervical adenopathy.     Upper Body:     Right upper body: No supraclavicular adenopathy.     Left upper body: No supraclavicular adenopathy.  Skin:    General: Skin is warm.     Findings: No erythema or rash.  Neurological:     Mental Status: She is alert and oriented to person, place, and time.  Psychiatric:     Comments: Well groomed, good eye contact.    ASSESSMENT AND PLAN:  There are no diagnoses linked to this encounter.   No follow-ups on file.   Tangia Pinard G.  [place], MD  [facility]. [place] office.  Discharge Instructions   None

## 2021-09-19 NOTE — Patient Instructions (Signed)
A few things to remember from today's visit:  External hemorrhoid - Plan: hydrocortisone (ANUSOL-HC) 25 MG suppository, Lidocaine 3 % CREA  Chronic constipation - Plan: linaclotide (LINZESS) 145 MCG CAPS capsule Ibuprofen 200 mg 2 tablets with meals 3 times per day for 3 to 5 days. Sitz bath with warm water as instructed 1-2 times per day.  If you need refills please call your pharmacy. Do not use My Chart to request refills or for acute issues that need immediate attention.   Hemorroides Hemorrhoids Las hemorroides son venas inflamadas que pueden desarrollarse: En el ano (recto). Estas se denominan hemorroides internas. Alrededor de la abertura del ano. Estas se denominan hemorroides externas. Las hemorroides pueden causar dolor, picazn o hemorragias. Generalmente no causan problemas graves. Con frecuencia mejoran al [employer] dieta, el estilo de vida y otros tratamientos Financial planner. Cules son las causas? Esta afeccin puede ser causada por lo siguiente: Tener dificultad para defecar (estreimiento). Hacer mucha fuerza (esfuerzo) para defecar. Materia fecal lquida (diarrea). Embarazo. Tener mucho sobrepeso (obesidad). Estar sentado durante largos perodos de [place]. Levantar objetos pesados u otras actividades que impliquen esfuerzo. Sexo anal. Andar en bicicleta por un largo perodo de tiempo. Cules son los signos o los sntomas? Los sntomas de esta afeccin incluyen: Social research officer, government. Picazn o irritacin en el ano. Sangrado proveniente del ano. Prdida de materia fecal. Inflamacin en la zona. Uno o ms bultos alrededor de la abertura del ano. Cmo se diagnostica? A menudo un mdico puede diagnosticar esta afeccin al observar la zona afectada. El mdico tambin puede: Optometrist un examen que implica palpar la zona con la mano enguantada (examen rectal digital). Examinar el interior de la zona anal utilizando un pequeo tubo (anoscopio). Pedir anlisis de [place]. Es posible  que esto se realice si ha perdido Optometrist. Solicitarle un estudio que consiste en la observacin del interior del colon utilizando un tubo flexible con una cmara en el extremo (sigmoidoscopia o colonoscopa). Cmo se trata? Esta afeccin generalmente se puede tratar en el hogar. El mdico puede indicarle que cambie de Designer, multimedia, de estilo de vida o que trate de Physicist, medical. Si esto no da resultado, se pueden realizar procedimientos para extirpar las hemorroides o reducir Publishing rights manager. Estos pueden implicar lo siguiente: Building services engineer en la base de las hemorroides para interrumpir la irrigacin de Herbalist. Inyectar un medicamento en las hemorroides para reducir Publishing rights manager. Dirigir un tipo de Teacher, early years/pre de luz hacia las hemorroides para Field seismologist que se caigan. Realizar Ardelia Mems ciruga para extirpar las hemorroides o cortar la irrigacin de [place]. Siga estas instrucciones en su casa: Comida y bebida  Consuma alimentos con alto contenido de [place]. Entre ellos cereales integrales, frijoles, frutos secos, frutas y verduras. Pregntele a su mdico acerca de tomar productos con fibra aadida en ellos (complementos de fibra). Disminuya la cantidad de grasa de la dieta. Para esto, puede hacer lo siguiente: Coma productos lcteos descremados. Coma menos carne roja. No consuma alimentos procesados. Beba suficiente lquido para Consulting civil engineer orina de color amarillo plido. Control del dolor y [place] un bao de agua tibia (bao de asiento) durante 20 minutos para Best boy. Hgalo 3 o 4 veces al da. Puede hacer esto en una baera o usar un dispositivo porttil para bao de asiento que se coloca sobre el inodoro. Si se lo indican, aplique hielo sobre la zona dolorida. Puede ser beneficioso aplicar hielo [employer] baos con agua tibia. Ponga el hielo  en una bolsa plstica. Coloque una toalla entre la piel y Therapist, nutritional. Aplique el hielo durante 20 minutos, 2 o 3  veces por da. Instrucciones generales Use los medicamentos de venta libre y los recetados solamente como se lo haya indicado el mdico. Las cremas medicinales y los medicamentos pueden usarse como se lo hayan indicado. Haga ejercicio fsico con frecuencia. Consulte al mdico qu tipos de ejercicios son mejores para usted y qu cantidad. Vaya al bao cuando sienta ganas de defecar. No espere. Evite hacer demasiada fuerza al defecar. Mantenga el ano seco y limpio. Use papel higinico hmedo o toallitas humedecidas despus de defecar. No pase mucho tiempo sentado en el inodoro. Concurra a todas las visitas de seguimiento como se lo haya indicado el mdico. Esto es importante. Comunquese con un mdico si: Tiene dolor e hinchazn que no mejoran con el tratamiento o los medicamentos. Tiene problemas para defecar. No puede defecar. Tiene dolor o hinchazn en la zona exterior de las hemorroides. Solicite ayuda de inmediato si tiene: Hemorragia que no se detiene. Resumen Las hemorroides son venas hinchadas en el ano o la zona que rodea el ano. Pueden causar dolor, picazn o sangrado. Consuma alimentos con alto contenido de [place]. Entre ellos cereales integrales, frijoles, frutos secos, frutas y verduras. Tome un bao de agua tibia (bao de asiento) durante 20 minutos para Best boy. Hgalo 3 o 4 veces al da. Esta informacin no tiene Marine scientist el consejo del mdico. Asegrese de hacerle al mdico cualquier pregunta que tenga. Document Revised: 11/26/2020 Document Reviewed: 11/26/2020 Elsevier Patient Education  [place].  Please be sure medication list is accurate. If a new problem present, please set up appointment sooner than planned today.

## 2021-09-22 DIAGNOSIS — F4010 Social phobia, unspecified: Secondary | ICD-10-CM | POA: Diagnosis not present

## 2021-10-01 DIAGNOSIS — F4010 Social phobia, unspecified: Secondary | ICD-10-CM | POA: Diagnosis not present

## 2021-10-10 DIAGNOSIS — F4010 Social phobia, unspecified: Secondary | ICD-10-CM | POA: Diagnosis not present

## 2021-10-28 DIAGNOSIS — F4010 Social phobia, unspecified: Secondary | ICD-10-CM | POA: Diagnosis not present

## 2021-10-30 DIAGNOSIS — F4010 Social phobia, unspecified: Secondary | ICD-10-CM | POA: Diagnosis not present

## 2021-10-31 ENCOUNTER — Other Ambulatory Visit: Payer: Self-pay | Admitting: Family

## 2021-10-31 ENCOUNTER — Other Ambulatory Visit: Payer: Self-pay | Admitting: Family Medicine

## 2021-10-31 ENCOUNTER — Encounter: Payer: Self-pay | Admitting: Family Medicine

## 2021-10-31 DIAGNOSIS — K644 Residual hemorrhoidal skin tags: Secondary | ICD-10-CM

## 2021-10-31 MED ORDER — AMPHETAMINE-DEXTROAMPHETAMINE 10 MG PO TABS: 10.0000 mg | ORAL_TABLET | Freq: Every day | ORAL | 0 refills | Status: DC

## 2021-11-05 ENCOUNTER — Telehealth: Payer: Self-pay | Admitting: *Deleted

## 2021-11-05 NOTE — Telephone Encounter (Signed)
Unable to leave a message due to voicemail being full. 

## 2021-11-05 NOTE — Telephone Encounter (Signed)
[facility] faxed a refill request for Buspar 7.'5mg'$ -take 1 twice a day-#180.  Message sent to [facility].

## 2021-11-10 NOTE — Telephone Encounter (Signed)
Unable to leave a message due to voicemail being full. 

## 2021-11-13 NOTE — Telephone Encounter (Signed)
Mychart message sent as unable to leave a message due to voicemail being full-3rd attempt.

## 2021-12-08 NOTE — Progress Notes (Unsigned)
HPI: Michele Gilbert is a 41 y.o. female with medical history significant for anxiety, depression, vein disease, ADHD, and constipation here today to establish care.  Former PCP: Dr. Ethlyn Gallery Last preventive routine visit: 03/26/21.  I saw pt on 09/19/21 for hemorrhoids and constipation.  These problems have been chronic. She takes bisacodyl 5 mg 1.5 tablet daily. She has a daily bowel movement. Negative for blood in the stool or melena. No associated abdominal pain, nausea, vomiting, or urinary symptoms. She has not seen GI before. Last visit Linzess was recommended, she could not afford.  Family history positive for colon cancer: Maternal grandmother and uncle.  -Noted weight loss since 10/2020.  States that when she has episodes of worsening anxiety she has low appetite, usually loses some weight during acute episodes of stress/anxiety.  Lab Results  Component Value Date   TSH 2.67 09/23/2018   Lab Results  Component Value Date   WBC 8.0 10/25/2019   HGB 14.0 10/25/2019   HCT 41.6 10/25/2019   MCV 87.9 10/25/2019   PLT 296 10/25/2019   Lab Results  Component Value Date   WBC 8.0 10/25/2019   HGB 14.0 10/25/2019   HCT 41.6 10/25/2019   MCV 87.9 10/25/2019   PLT 296 10/25/2019   Anxiety: She took Buspar about 2-3 years ago, she discontinued because she does not like to take medications. She has also tried Zoloft and Lexapro, she did not tolerate this medication well.  She sees a therapist regularly, twice per month.  Interval of the sections while she was in [place] for visiting her parents. Diagnosed with ADHD about a year ago by her therapist, felt like this was aggravating anxiety.  She is currently on Adderall 10 mg daily.  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     12/09/2021   12:16 PM 06/14/2020    5:04 PM 11/29/2018     8:14 AM 09/21/2018    3:43 PM 09/17/2016    7:17 AM  Depression screen PHQ 2/9  Decreased Interest 0 0 0 2 0  Down, Depressed, Hopeless 0 0 0 2 0  PHQ - 2 Score 0 0 0 4 0  Altered sleeping '1 1 1 3   '$ Tired, decreased energy 0 '1 2 3   '$ Change in appetite 1 0 0 0   Feeling bad or failure about yourself  0 0 3 0   Trouble concentrating 2  0 0   Moving slowly or fidgety/restless 0  2 0   Suicidal thoughts 0 0 0 0   PHQ-9 Score '4 2 8 10   '$ Difficult doing work/chores Not difficult at all   Not difficult at all    She needs a form to be completed, she is applying for teaching assistant position. Vaccination reported as up-to-date. She needs a TB screening test done.  Review of Systems  Constitutional:  Negative for activity change, appetite change and fever.  HENT:  Negative for mouth sores, nosebleeds and sore throat.   Respiratory:  Negative for cough, shortness of breath and wheezing.   Cardiovascular:  Negative for chest pain, palpitations and leg swelling.  Endocrine: Negative for cold intolerance and heat intolerance.  Genitourinary:  Negative for decreased urine volume, dysuria and hematuria.  Neurological:  Negative for syncope, weakness and headaches.  Psychiatric/Behavioral:  Negative for confusion.  The patient is nervous/anxious.   Rest see pertinent positives and negatives per HPI.  Current Outpatient Medications on File Prior to Visit  Medication Sig Dispense Refill   amphetamine-dextroamphetamine (ADDERALL) 10 MG tablet Take 1 tablet (10 mg total) by mouth daily with breakfast. 30 tablet 0   amphetamine-dextroamphetamine (ADDERALL) 10 MG tablet Take 1 tablet (10 mg total) by mouth daily with breakfast. 30 tablet 0   betamethasone dipropionate 0.05 % lotion Apply 1 application topically daily.     Bisacodyl (DULCOLAX PO) Take by mouth as needed.     doxycycline (VIBRAMYCIN) 50 MG capsule Take 50 mg by mouth daily.     finasteride (PROSCAR) 5 MG tablet Take 2.5 mg by mouth  daily.     linaclotide (LINZESS) 145 MCG CAPS capsule Take 1 capsule (145 mcg total) by mouth daily before breakfast. 30 capsule 0   tretinoin (RETIN-A) 0.025 % cream Apply 1 application topically at bedtime.     No current facility-administered medications on file prior to visit.   Past Medical History:  Diagnosis Date   Constipation    Dysplastic nevus 09/22/2016   - R upper lateral arm (deltoid) -S/p biopsy 09/2016 - moderate atypia w/ recs from pathology for conservative re-excision if recurrent  pigmentation at site   Headache(784.0)    frequent   Mastitis    Migraine    UTI (urinary tract infection)    Allergies  Allergen Reactions   Latex Dermatitis   Family History  Problem Relation Age of Onset   Vision loss Mother    Hyperlipidemia Father    Cancer Maternal Uncle        stomach; mets   Cancer Maternal Grandmother        stomach   Diabetes Paternal Grandfather    Colon cancer Other        grandparent   Diabetes Other    Breast cancer Neg Hx    Social History   Socioeconomic History   Marital status: Married    Spouse name: Not on file   Number of children: Not on file   Years of education: Not on file   Highest education level: Not on file  Occupational History   Not on file  Tobacco Use   Smoking status: Never   Smokeless tobacco: Never  Substance and Sexual Activity   Alcohol use: Yes    Alcohol/week: 1.0 standard drink of alcohol    Types: 1 Glasses of wine per week    Comment: occasional   Drug use: No   Sexual activity: Yes    Birth control/protection: None  Other Topics Concern   Not on file  Social History Narrative   Work or School:      Home Situation: lives with husband and 2 children (3 and 30 yo in 2018)   From [place]      Spiritual Beliefs:      Lifestyle:   Social Determinants of Health   Financial Resource Strain: Not on file  Food Insecurity: Not on file  Transportation Needs: Not on file  Physical Activity: Not on file   Stress: Not on file  Social Connections: Not on file   Vitals:   12/09/21 1142  BP: 118/70  Pulse: 88  Resp: 12  SpO2: 98%   Wt Readings from Last 3 Encounters:  12/09/21 119 lb 4 oz (54.1 kg)  09/19/21 129 lb 8 oz (58.7 kg)  10/21/20 140 lb (63.5 kg)   Body mass index is 21.12  kg/m.  Physical Exam Vitals and nursing note reviewed.  Constitutional:      General: She is not in acute distress.    Appearance: She is well-developed.  HENT:     Head: Normocephalic and atraumatic.     Mouth/Throat:     Mouth: Mucous membranes are moist.     Pharynx: Oropharynx is clear.  Eyes:     Conjunctiva/sclera: Conjunctivae normal.  Cardiovascular:     Rate and Rhythm: Normal rate and regular rhythm.     Heart sounds: No murmur heard. Pulmonary:     Effort: Pulmonary effort is normal. No respiratory distress.     Breath sounds: Normal breath sounds.  Abdominal:     Palpations: Abdomen is soft. There is no hepatomegaly or mass.     Tenderness: There is no abdominal tenderness.  Lymphadenopathy:     Cervical: No cervical adenopathy.  Skin:    General: Skin is warm.     Findings: No erythema or rash.  Neurological:     General: No focal deficit present.     Mental Status: She is alert and oriented to person, place, and time.     Cranial Nerves: No cranial nerve deficit.     Gait: Gait normal.  Psychiatric:        Mood and Affect: Mood is anxious.   ASSESSMENT AND PLAN:  [name] was seen today for establish care.  Diagnoses and all orders for this visit: Orders Placed This Encounter  Procedures   CBC   Basic metabolic panel   TSH   QuantiFERON-TB Gold Plus   Hepatitis C antibody   Lab Results  Component Value Date   TSH 1.27 12/09/2021   Lab Results  Component Value Date   CREATININE 0.66 12/09/2021   BUN 16 12/09/2021   NA 138 12/09/2021   K 4.0 12/09/2021   CL 104 12/09/2021   CO2 25 12/09/2021   Lab Results  Component Value Date   WBC 6.2 12/09/2021    HGB 12.5 12/09/2021   HCT 37.8 12/09/2021   MCV 81.4 12/09/2021   PLT 340.0 12/09/2021   GAD (generalized anxiety disorder) Problem is overall stable. She is not interested pharmacologic treatment for now. Recommend continuing CBT.  Chronic constipation Problem has been stable for year. Most likely IBS, she could not afford linzess. FHx positive for colon cancer.She has not had colonoscopy and for now she does not want GI referral. Continue choreal 5 mg 1.5 tablet daily. Continue adequate fiber and fluid intake. She could add MiraLAX daily as needed.  Attention deficit hyperactivity disorder (ADHD) Continue Adderall 10 mg daily as needed. Some side effects discussed, she does not feel like medication aggravates anxiety. Will sign medication contract next visit. F/U in 5-6 months, before if needed.  Screening-pulmonary TB -     QuantiFERON-TB Gold Plus  Weight loss, non-intentional Since her last visit, 09/19/21, she has lost about 10-11 Lb, which most likely caused by decreased oral intake. We discussed other possible etiologies. Blood work done today. She is not interested in GI evaluation for now and her mammogram/gyn preventive care is up to date.  Encounter for HCV screening test for low risk patient -     Hepatitis C antibody  She forgot to bring the form today, she will send it through MyChart or we will drop it off.   Return in about 6 months (around 06/11/2022).  Shikira Folino G. Martin[place], MD  [facility]. [place] office.

## 2021-12-09 ENCOUNTER — Encounter: Payer: Self-pay | Admitting: Family Medicine

## 2021-12-09 ENCOUNTER — Ambulatory Visit (INDEPENDENT_AMBULATORY_CARE_PROVIDER_SITE_OTHER): Payer: BC Managed Care – PPO | Admitting: Family Medicine

## 2021-12-09 VITALS — BP 118/70 | HR 88 | Resp 12 | Ht 63.0 in | Wt 119.2 lb

## 2021-12-09 DIAGNOSIS — F411 Generalized anxiety disorder: Secondary | ICD-10-CM

## 2021-12-09 DIAGNOSIS — R634 Abnormal weight loss: Secondary | ICD-10-CM | POA: Diagnosis not present

## 2021-12-09 DIAGNOSIS — F909 Attention-deficit hyperactivity disorder, unspecified type: Secondary | ICD-10-CM

## 2021-12-09 DIAGNOSIS — Z1159 Encounter for screening for other viral diseases: Secondary | ICD-10-CM

## 2021-12-09 DIAGNOSIS — K5909 Other constipation: Secondary | ICD-10-CM | POA: Diagnosis not present

## 2021-12-09 DIAGNOSIS — Z111 Encounter for screening for respiratory tuberculosis: Secondary | ICD-10-CM | POA: Diagnosis not present

## 2021-12-09 LAB — CBC
HCT: 37.8 % (ref 36.0–46.0)
Hemoglobin: 12.5 g/dL (ref 12.0–15.0)
MCHC: 33.1 g/dL (ref 30.0–36.0)
MCV: 81.4 fl (ref 78.0–100.0)
Platelets: 340 10*3/uL (ref 150.0–400.0)
RBC: 4.65 Mil/uL (ref 3.87–5.11)
RDW: 14.1 % (ref 11.5–15.5)
WBC: 6.2 10*3/uL (ref 4.0–10.5)

## 2021-12-09 LAB — BASIC METABOLIC PANEL
BUN: 16 mg/dL (ref 6–23)
CO2: 25 mEq/L (ref 19–32)
Calcium: 9.3 mg/dL (ref 8.4–10.5)
Chloride: 104 mEq/L (ref 96–112)
Creatinine, Ser: 0.66 mg/dL (ref 0.40–1.20)
GFR: 108.56 mL/min (ref >60.00)
Glucose, Bld: 93 mg/dL (ref 70–99)
Potassium: 4 mEq/L (ref 3.5–5.1)
Sodium: 138 mEq/L (ref 135–145)

## 2021-12-09 LAB — TSH: TSH: 1.27 u[IU]/mL (ref 0.35–5.50)

## 2021-12-09 NOTE — Assessment & Plan Note (Signed)
Continue Adderall 10 mg daily as needed. Some side effects discussed, she does not feel like medication aggravates anxiety. Will sign medication contract next visit. F/U in 5-6 months, before if needed.

## 2021-12-09 NOTE — Assessment & Plan Note (Signed)
Problem has been stable for year. Most likely IBS, she could not afford linzess. FHx positive for colon cancer.She has not had colonoscopy and for now she does not want GI referral. Continue choreal 5 mg 1.5 tablet daily. Continue adequate fiber and fluid intake. She could add MiraLAX daily as needed.

## 2021-12-09 NOTE — Patient Instructions (Addendum)
A few things to remember from today's visit:   Attention deficit hyperactivity disorder (ADHD), unspecified ADHD type  Chronic constipation - Plan: CANCELED: Ambulatory referral to Gastroenterology  GAD (generalized anxiety disorder)  Screening-pulmonary TB - Plan: QuantiFERON-TB Gold Plus  Weight loss, non-intentional - Plan: CBC, Basic metabolic panel, TSH  Encounter for HCV screening test for low risk patient - Plan: Hepatitis C antibody  If you need refills please call your pharmacy. Do not use My Chart to request refills or for acute issues that need immediate attention.   If you changed your mind about gastroenterologist evaluation, please let me know. You can drop form to be completed. No changes in medications today.   Please be sure medication list is accurate. If a new problem present, please set up appointment sooner than planned today.

## 2021-12-09 NOTE — Assessment & Plan Note (Signed)
Problem is overall stable. She is not interested pharmacologic treatment for now. Recommend continuing CBT.

## 2021-12-10 ENCOUNTER — Encounter: Payer: Self-pay | Admitting: Family Medicine

## 2021-12-12 LAB — QUANTIFERON-TB GOLD PLUS
Mitogen-NIL: >10 IU/mL
NIL: 0.06 IU/mL
QuantiFERON-TB Gold Plus: NEGATIVE
TB1-NIL: 0.03 IU/mL
TB2-NIL: 0.03 IU/mL

## 2021-12-12 LAB — HEPATITIS C ANTIBODY: Hepatitis C Ab: NONREACTIVE

## 2021-12-22 DIAGNOSIS — L661 Lichen planopilaris: Secondary | ICD-10-CM | POA: Diagnosis not present

## 2021-12-22 DIAGNOSIS — L649 Androgenic alopecia, unspecified: Secondary | ICD-10-CM | POA: Diagnosis not present

## 2021-12-22 DIAGNOSIS — L718 Other rosacea: Secondary | ICD-10-CM | POA: Diagnosis not present

## 2021-12-29 DIAGNOSIS — F4010 Social phobia, unspecified: Secondary | ICD-10-CM | POA: Diagnosis not present

## 2022-01-02 ENCOUNTER — Encounter: Payer: BC Managed Care – PPO | Admitting: Family Medicine

## 2022-01-14 DIAGNOSIS — F4010 Social phobia, unspecified: Secondary | ICD-10-CM | POA: Diagnosis not present

## 2022-01-19 DIAGNOSIS — F4010 Social phobia, unspecified: Secondary | ICD-10-CM | POA: Diagnosis not present

## 2022-02-21 DIAGNOSIS — F4010 Social phobia, unspecified: Secondary | ICD-10-CM | POA: Diagnosis not present

## 2022-02-25 DIAGNOSIS — F4010 Social phobia, unspecified: Secondary | ICD-10-CM | POA: Diagnosis not present

## 2022-03-02 ENCOUNTER — Encounter: Payer: Self-pay | Admitting: Family Medicine

## 2022-03-02 ENCOUNTER — Ambulatory Visit (INDEPENDENT_AMBULATORY_CARE_PROVIDER_SITE_OTHER): Payer: BC Managed Care – PPO | Admitting: Family Medicine

## 2022-03-02 VITALS — BP 100/70 | HR 86 | Temp 98.9°F | Resp 12 | Ht 63.0 in | Wt 115.1 lb

## 2022-03-02 DIAGNOSIS — F909 Attention-deficit hyperactivity disorder, unspecified type: Secondary | ICD-10-CM

## 2022-03-02 DIAGNOSIS — K5909 Other constipation: Secondary | ICD-10-CM | POA: Diagnosis not present

## 2022-03-02 DIAGNOSIS — F411 Generalized anxiety disorder: Secondary | ICD-10-CM

## 2022-03-02 MED ORDER — HYDROXYZINE HCL 25 MG PO TABS: 25.0000 mg | ORAL_TABLET | Freq: Two times a day (BID) | ORAL | 1 refills | Status: DC | PRN

## 2022-03-02 NOTE — Patient Instructions (Signed)
A few things to remember from today's visit:   GAD (generalized anxiety disorder) - Plan: hydrOXYzine (ATARAX) 25 MG tablet  Attention deficit hyperactivity disorder (ADHD), unspecified ADHD type  No changes in Adderall today. Hydroxyzine 25 mg 2 times daily as needed.  If you need refills for medications you take chronically, please call your pharmacy. Do not use My Chart to request refills or for acute issues that need immediate attention. If you send a my chart message, it may take a few days to be addressed, specially if I am not in the office.  Please be sure medication list is accurate. If a new problem present, please set up appointment sooner than planned today.

## 2022-03-02 NOTE — Progress Notes (Addendum)
HPI: Michele Gilbert is a 41 y.o. female, who is here today for follow up and to discuss her medication, Adderall, which she has been taking for over a year for ADD or ADHD, not sure which one.  She was last seen on 12/09/21. ADHD: She was screened by psychotherapists a few years ago. She takes Adderall 10 mg daily as needed, which helps tremendously. 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 No side effects reported.  She reports having problems at home with her husband for the past five months, leading to a weight loss of 14 pounds since May. She attributes the weight loss to anxiety and decreased appetite. She has had same problem during episodes of worsening anxiety.  She is currently seeing a counselor who, according to pt, advised her to address her GI issues and anxiety.   She has a long history of constipation and hemorrhoids, both exacerbated by stress. Alleviated by increasing fiber intake and following plain/bland diet for a few days. She has not had colonoscopy.   No associated abdominal pain,nausea,or vomiting.  Linzess 145 mcg was recommended in the past but could not afford it. She takes OTC stool softener. She has not noted blood in stool. Lab Results  Component Value Date   TSH 1.27 12/09/2021   Lab Results  Component Value Date   CREATININE 0.66 12/09/2021   BUN 16 12/09/2021   NA 138 12/09/2021   K 4.0 12/09/2021   CL 104 12/09/2021   CO2 25 12/09/2021   Lab Results  Component Value Date   WBC 6.2 12/09/2021   HGB 12.5 12/09/2021   HCT 37.8 12/09/2021   MCV 81.4 12/09/2021   PLT 340.0 12/09/2021   She is also requesting medication that she can take as needed to help her relax during episodes of stress. She states that she is not having panic attacks. Recent marital issues have aggravated problem. She reports feeling safe at home despite a recent incident, which she does not want to elaborate  on but discussed with her counselor. She has previously tried anxiety medications, some she remembers lexapro,Celexa,and sertraline but experienced adverse effects including nausea and vomiting.   Review of Systems  Constitutional:  Negative for activity change, appetite change and fever.  HENT:  Negative for mouth sores, nosebleeds and sore throat.   Eyes:  Negative for redness and visual disturbance.  Respiratory:  Negative for cough, shortness of breath and wheezing.   Cardiovascular:  Negative for chest pain and palpitations.  Endocrine: Negative for cold intolerance and heat intolerance.  Genitourinary:  Negative for decreased urine volume, dysuria and hematuria.  Neurological:  Negative for syncope and weakness.  Psychiatric/Behavioral:  Positive for sleep disturbance. Negative for confusion and hallucinations. The patient is nervous/anxious.   Rest see pertinent positives and negatives per HPI.  Current Outpatient Medications on File Prior to Visit  Medication Sig Dispense Refill   amphetamine-dextroamphetamine (ADDERALL) 10 MG tablet Take 1 tablet (10 mg total) by mouth daily with breakfast. 30 tablet 0   amphetamine-dextroamphetamine (ADDERALL) 10 MG tablet Take 1 tablet (10 mg total) by mouth daily with breakfast. 30 tablet 0   betamethasone dipropionate 0.05 % lotion Apply 1 application topically daily.     Bisacodyl (DULCOLAX PO) Take by mouth as needed.     doxycycline (VIBRAMYCIN) 50 MG capsule Take 50 mg by mouth daily.     finasteride (PROSCAR) 5 MG tablet Take  2.5 mg by mouth daily.     linaclotide (LINZESS) 145 MCG CAPS capsule Take 1 capsule (145 mcg total) by mouth daily before breakfast. 30 capsule 0   tretinoin (RETIN-A) 0.025 % cream Apply 1 application topically at bedtime.     No current facility-administered medications on file prior to visit.   Past Medical History:  Diagnosis Date   Constipation    Dysplastic nevus 09/22/2016   - R upper lateral arm  (deltoid) -S/p biopsy 09/2016 - moderate atypia w/ recs from pathology for conservative re-excision if recurrent  pigmentation at site   Headache(784.0)    frequent   Mastitis    Migraine    UTI (urinary tract infection)    Allergies  Allergen Reactions   Latex Dermatitis   Social History   Socioeconomic History   Marital status: Married    Spouse name: Not on file   Number of children: Not on file   Years of education: Not on file   Highest education level: Not on file  Occupational History   Not on file  Tobacco Use   Smoking status: Never   Smokeless tobacco: Never  Substance and Sexual Activity   Alcohol use: Yes    Alcohol/week: 1.0 standard drink of alcohol    Types: 1 Glasses of wine per week    Comment: occasional   Drug use: No   Sexual activity: Yes    Birth control/protection: None  Other Topics Concern   Not on file  Social History Narrative   Work or School:      Home Situation: lives with husband and 2 children (3 and 52 yo in 2018)   From [place]      Spiritual Beliefs:      Lifestyle:   Social Determinants of Health   Financial Resource Strain: Not on file  Food Insecurity: Not on file  Transportation Needs: Not on file  Physical Activity: Not on file  Stress: Not on file  Social Connections: Not on file   Vitals:   03/02/22 1125  BP: 100/70  Pulse: 86  Resp: 12  Temp: 98.9 F (37.2 C)  SpO2: 97%  Body mass index is 20.39 kg/m.  Physical Exam Vitals and nursing note reviewed.  Constitutional:      General: She is not in acute distress.    Appearance: She is well-developed and well-groomed.  HENT:     Head: Normocephalic and atraumatic.     Mouth/Throat:     Mouth: Mucous membranes are moist.     Pharynx: Oropharynx is clear.  Eyes:     Conjunctiva/sclera: Conjunctivae normal.  Cardiovascular:     Rate and Rhythm: Normal rate and regular rhythm.     Heart sounds: No murmur heard. Pulmonary:     Effort: Pulmonary effort is  normal. No respiratory distress.     Breath sounds: Normal breath sounds.  Abdominal:     Palpations: Abdomen is soft. There is no hepatomegaly or mass.     Tenderness: There is no abdominal tenderness.  Lymphadenopathy:     Cervical: No cervical adenopathy.  Skin:    General: Skin is warm.     Findings: No erythema or rash.  Neurological:     General: No focal deficit present.     Mental Status: She is alert and oriented to person, place, and time.     Cranial Nerves: No cranial nerve deficit.     Gait: Gait normal.  Psychiatric:  Mood and Affect: Affect normal. Mood is anxious.   ASSESSMENT AND PLAN:  Michele Gilbert was seen today for follow-up.  Diagnoses and all orders for this visit:  Chronic constipation Problem has been stable for years. She is not interested in establishing with GI. Continue adequate fluid and fiber intake. Instructed about warning signs.  Attention deficit hyperactivity disorder (ADHD), unspecified ADHD type Problem is well controlled with Adderall 10 mg daily prn. She does not need a refill at this time, will call when she doe. Some side effects discussed. PMP reviewed and med contact signed today.  GAD (generalized anxiety disorder) She has not tolerated SSRI's in the past, aggravate GI symptoms and not interested in trying again. Hydralazine is an option, she agrees with trying, 25 mg bid as needed. We discussed some side effects.  -     hydrOXYzine (ATARAX) 25 MG tablet; Take 1 tablet (25 mg total) by mouth 2 (two) times daily as needed for anxiety.  Return in about 6 months (around 09/01/2022).  Johnathan Tortorelli G. [place], MD  [facility]. [place] office.

## 2022-03-03 ENCOUNTER — Encounter: Payer: Self-pay | Admitting: Family Medicine

## 2022-03-11 ENCOUNTER — Other Ambulatory Visit: Payer: Self-pay | Admitting: Family Medicine

## 2022-03-30 DIAGNOSIS — Z1231 Encounter for screening mammogram for malignant neoplasm of breast: Secondary | ICD-10-CM | POA: Diagnosis not present

## 2022-03-30 DIAGNOSIS — Z01419 Encounter for gynecological examination (general) (routine) without abnormal findings: Secondary | ICD-10-CM | POA: Diagnosis not present

## 2022-03-30 DIAGNOSIS — Z6820 Body mass index (BMI) 20.0-20.9, adult: Secondary | ICD-10-CM | POA: Diagnosis not present

## 2022-04-13 DIAGNOSIS — F4010 Social phobia, unspecified: Secondary | ICD-10-CM | POA: Diagnosis not present

## 2022-04-23 ENCOUNTER — Encounter: Payer: Self-pay | Admitting: Family Medicine

## 2022-04-24 MED ORDER — AMPHETAMINE-DEXTROAMPHETAMINE 10 MG PO TABS: 10.0000 mg | ORAL_TABLET | Freq: Every day | ORAL | 0 refills | Status: DC

## 2022-09-30 DIAGNOSIS — R1013 Epigastric pain: Secondary | ICD-10-CM | POA: Diagnosis not present

## 2022-12-07 DIAGNOSIS — L649 Androgenic alopecia, unspecified: Secondary | ICD-10-CM | POA: Diagnosis not present

## 2023-05-03 ENCOUNTER — Encounter: Payer: Self-pay | Admitting: Hematology and Oncology

## 2023-08-08 IMAGING — US US BREAST BX W LOC DEV 1ST LESION IMG BX SPEC US GUIDE*L*
1 series · 9 of 9 positions shown · non-contrast
Comparison: Previous exam(s).
COMPARISON: Previous exam(s).

Addendum:
CLINICAL DATA: 40-year-old female presenting for biopsy of a mass
in the left breast.

EXAM:
ULTRASOUND GUIDED LEFT BREAST CORE NEEDLE BIOPSY

[Series 1: us breast bx w loc dev 1st lesion img bx spec us g · 0.06mm/px · 9 of 9 slices shown]
[im 1/9]
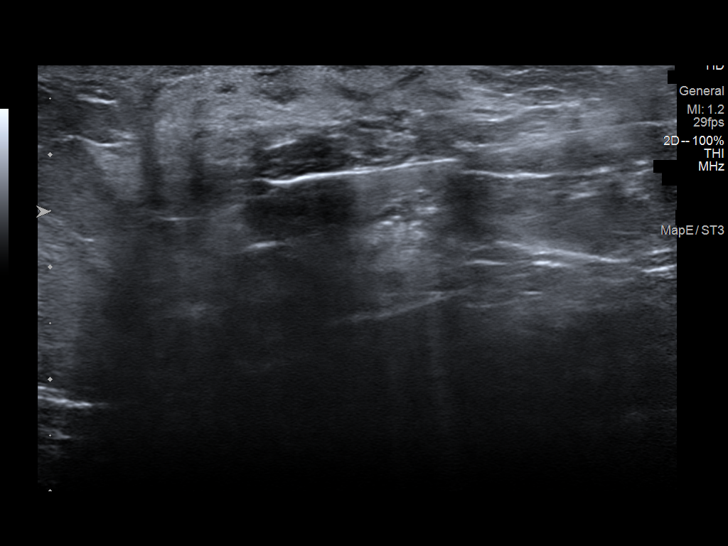
[im 2/9]
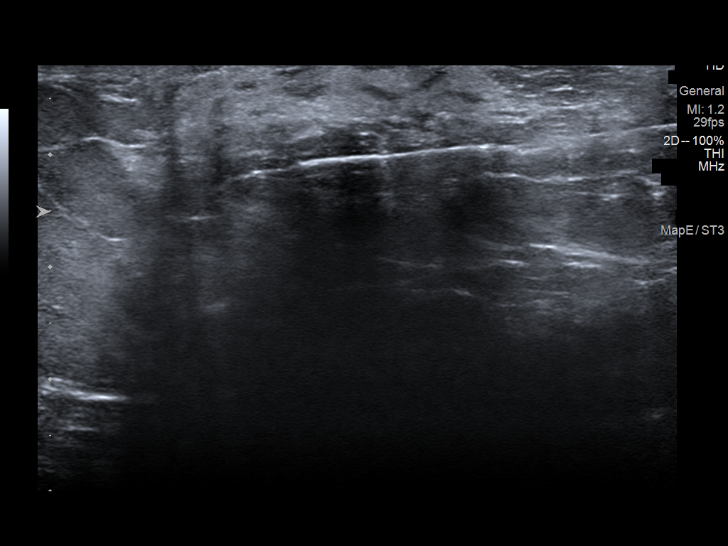
[im 3/9]
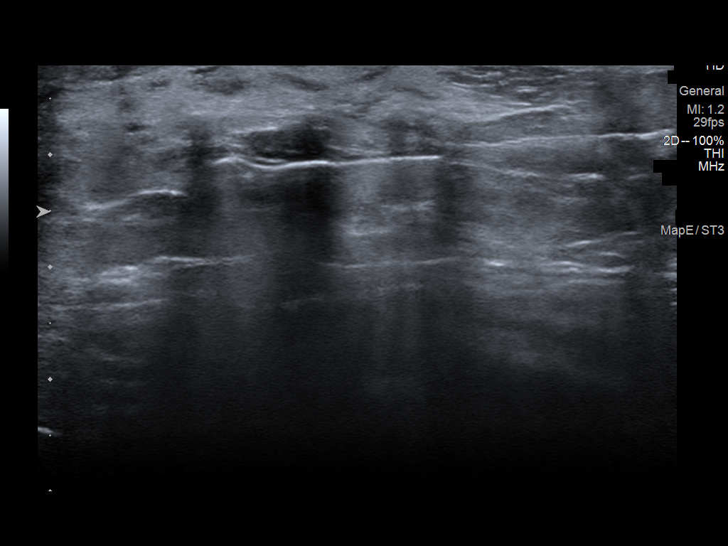
[im 4/9]
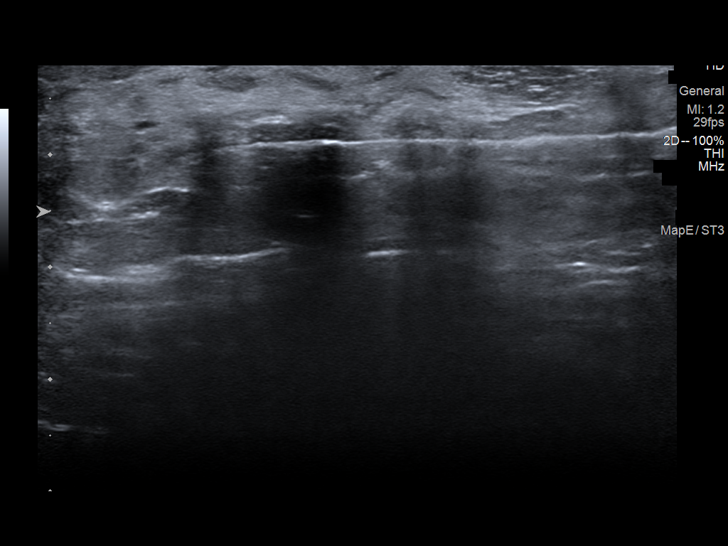
[im 5/9]
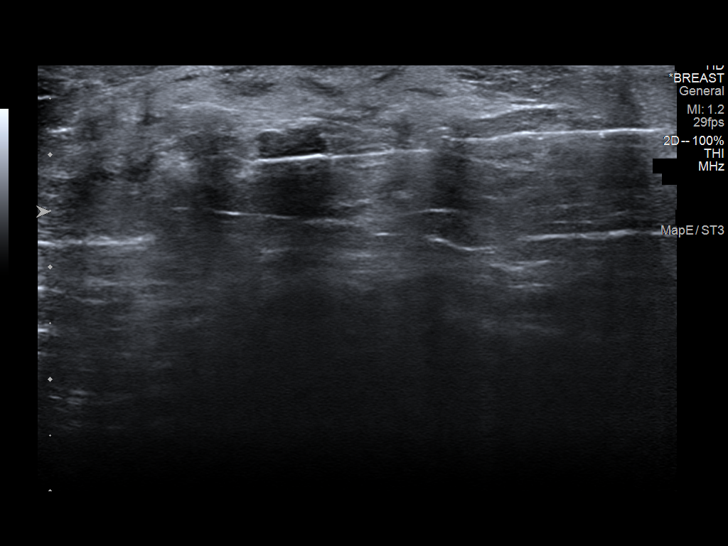
[im 6/9]
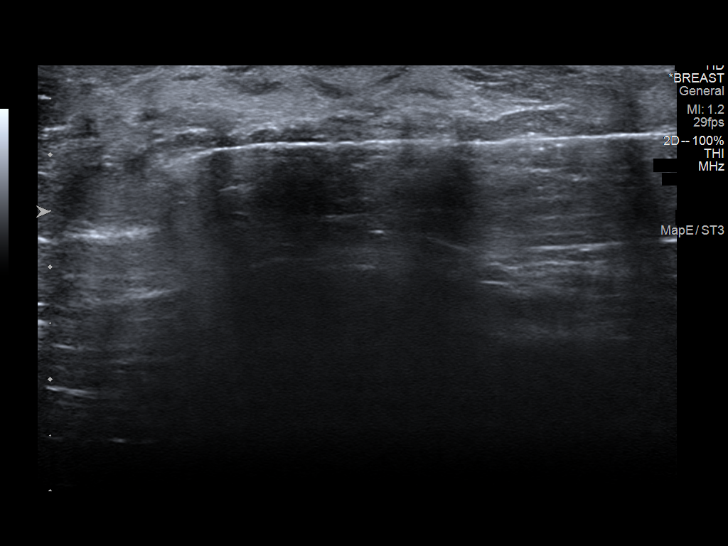
[im 7/9]
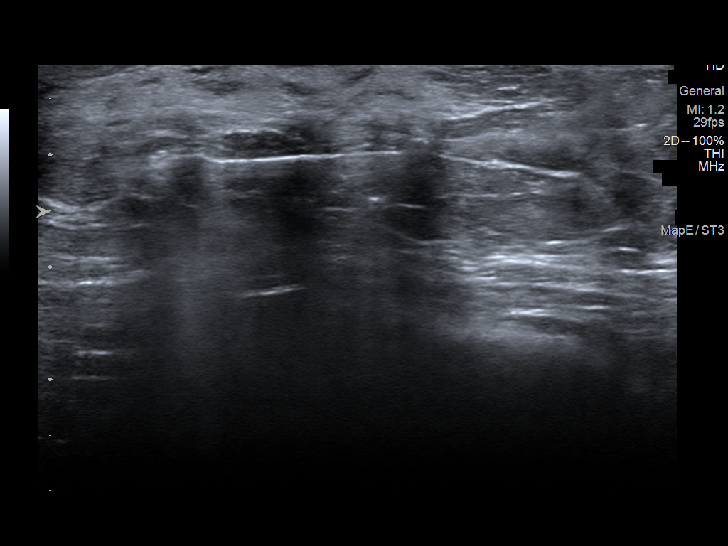
[im 8/9]
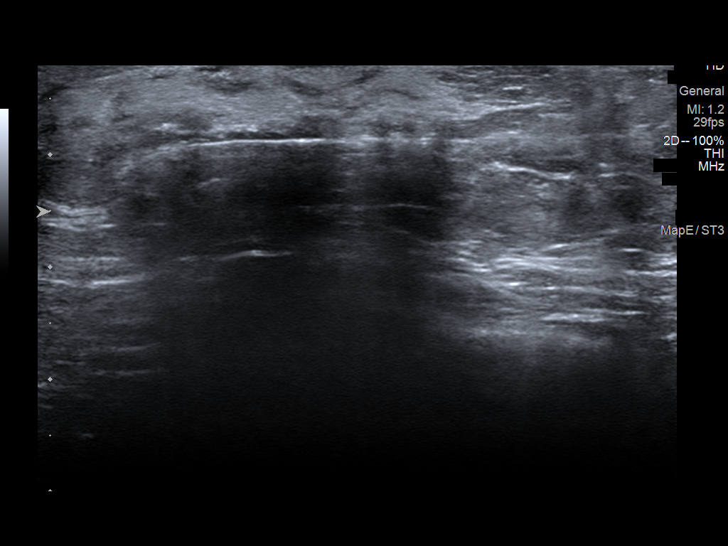
[im 9/9]
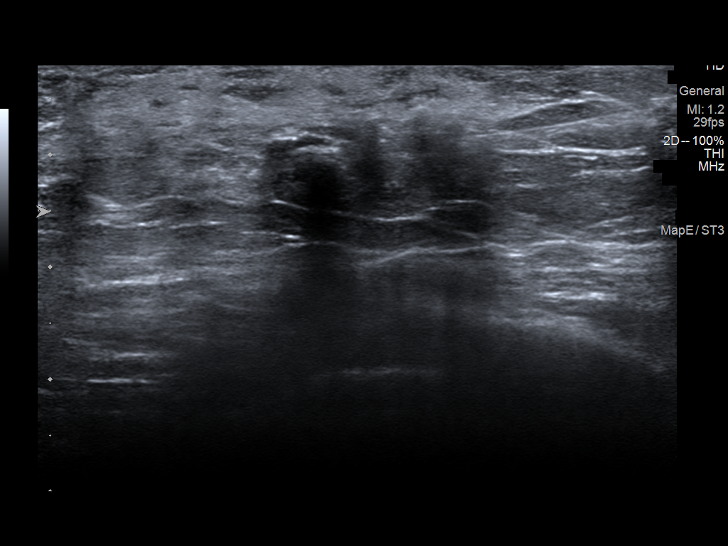

[9 of 9 positions shown; findings below may reference images not displayed]



Lesion quadrant: Lower outer quadrant

Using sterile technique and 1% Lidocaine as local anesthetic, under
direct ultrasound visualization, a 14 gauge Doneisha device was
used to perform biopsy of a mass in the left breast at retroareolar
3 o'clock using a lateral approach. At the conclusion of the
procedure a coil tissue marker clip was deployed into the biopsy
cavity. Follow up 2 view mammogram was performed and dictated
separately.
IMPRESSION: Ultrasound guided biopsy of a mass in the left breast at
retroareolar 3 o'clock. No apparent complications.

ADDENDUM:
Pathology revealed FIBROADENOMA of the LEFT breast, 3 o'clock
retroareolar (coil clip). This was found to be concordant by Dr.
Jhemboy Padam.

Pathology results were discussed with the patient by telephone. The
patient reported doing well after the biopsy with tenderness at the
site. Post biopsy instructions and care were reviewed and questions
were answered. The patient was encouraged to call The [REDACTED]

The patient was instructed to return for annual screening
mammography and informed a reminder notice would be sent regarding
this appointment.

Pathology results reported by Sengiyunve Hazigamayo RN on 04/29/2021.



Lesion quadrant: Lower outer quadrant

Using sterile technique and 1% Lidocaine as local anesthetic, under
direct ultrasound visualization, a 14 gauge Doneisha device was
used to perform biopsy of a mass in the left breast at retroareolar
3 o'clock using a lateral approach. At the conclusion of the
procedure a coil tissue marker clip was deployed into the biopsy
cavity. Follow up 2 view mammogram was performed and dictated
separately.
IMPRESSION: Ultrasound guided biopsy of a mass in the left breast at
retroareolar 3 o'clock. No apparent complications.

## 2023-08-08 IMAGING — MG MM BREAST LOCALIZATION CLIP
4 series · 4 of 12 positions shown · non-contrast
Comparison: Previous exam(s).

CLINICAL DATA: Post procedure mammogram for clip placement.

EXAM:
3D DIAGNOSTIC LEFT MAMMOGRAM POST ULTRASOUND BIOPSY

[L ML synth-2D]
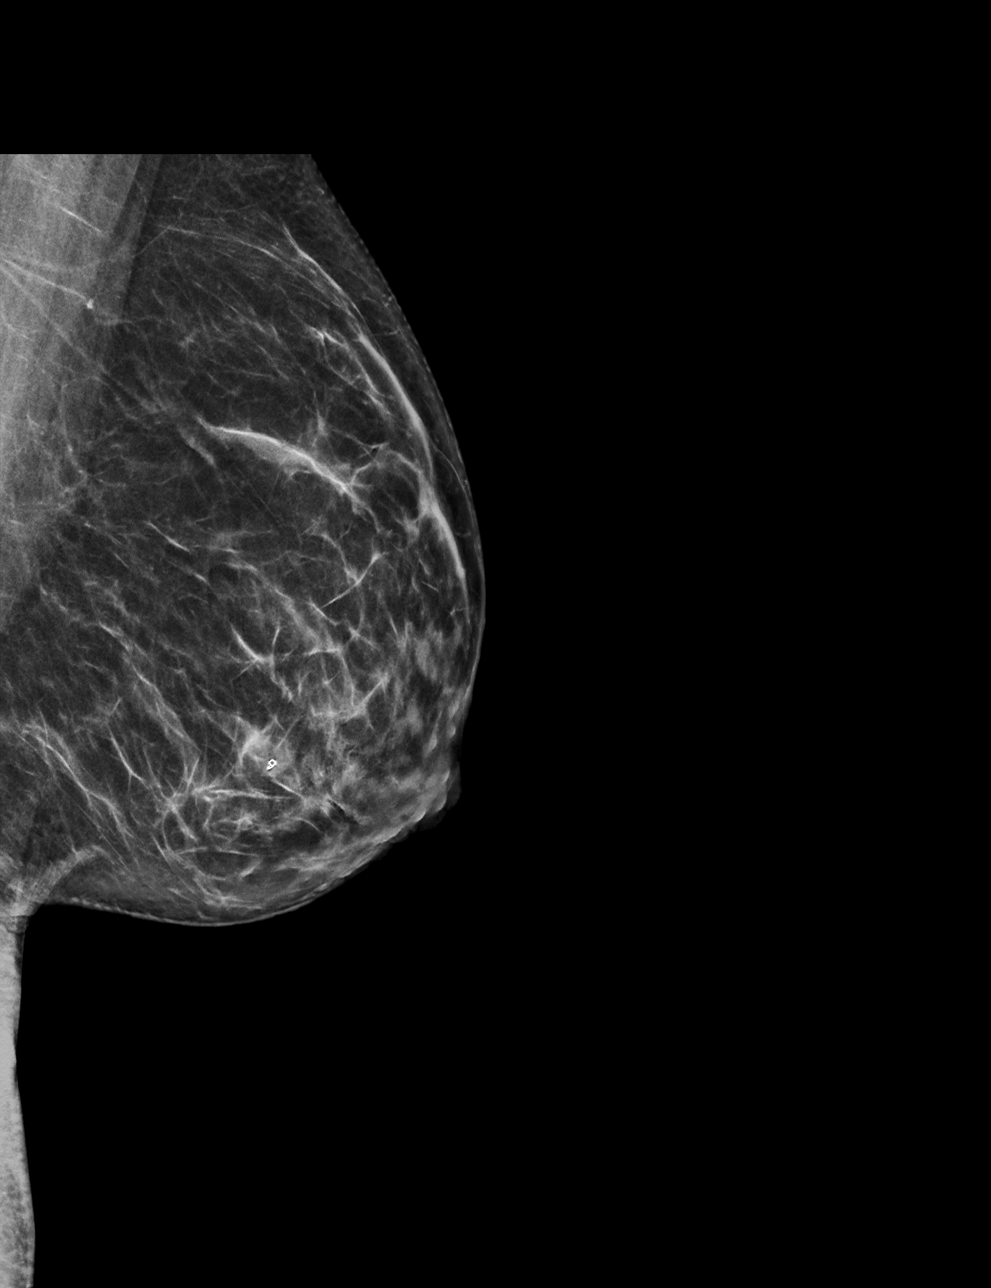

[L CC synth-2D]
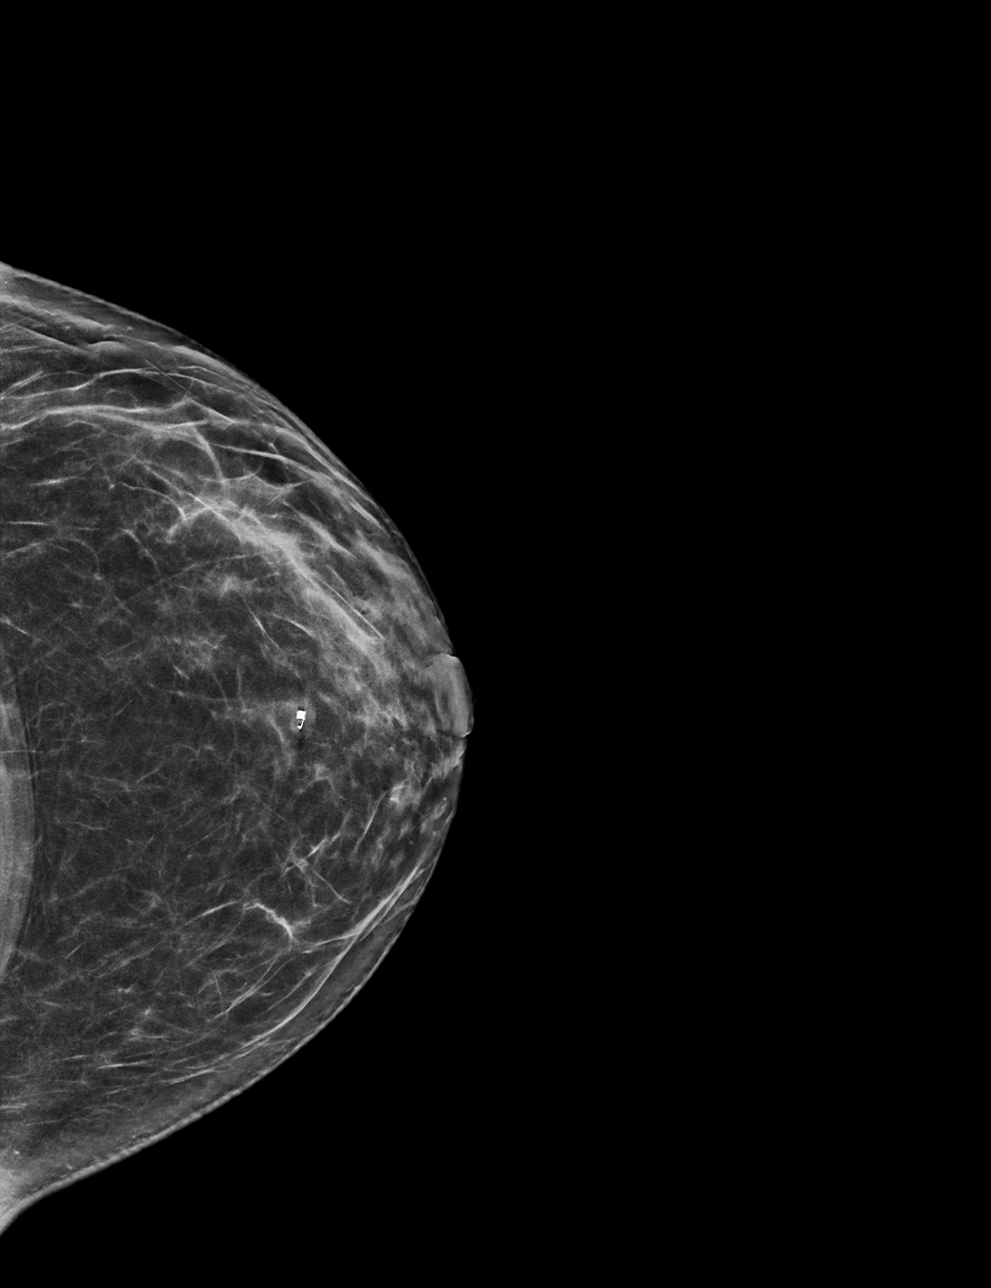

[L ML tomo · tomo slice 32/63.0]
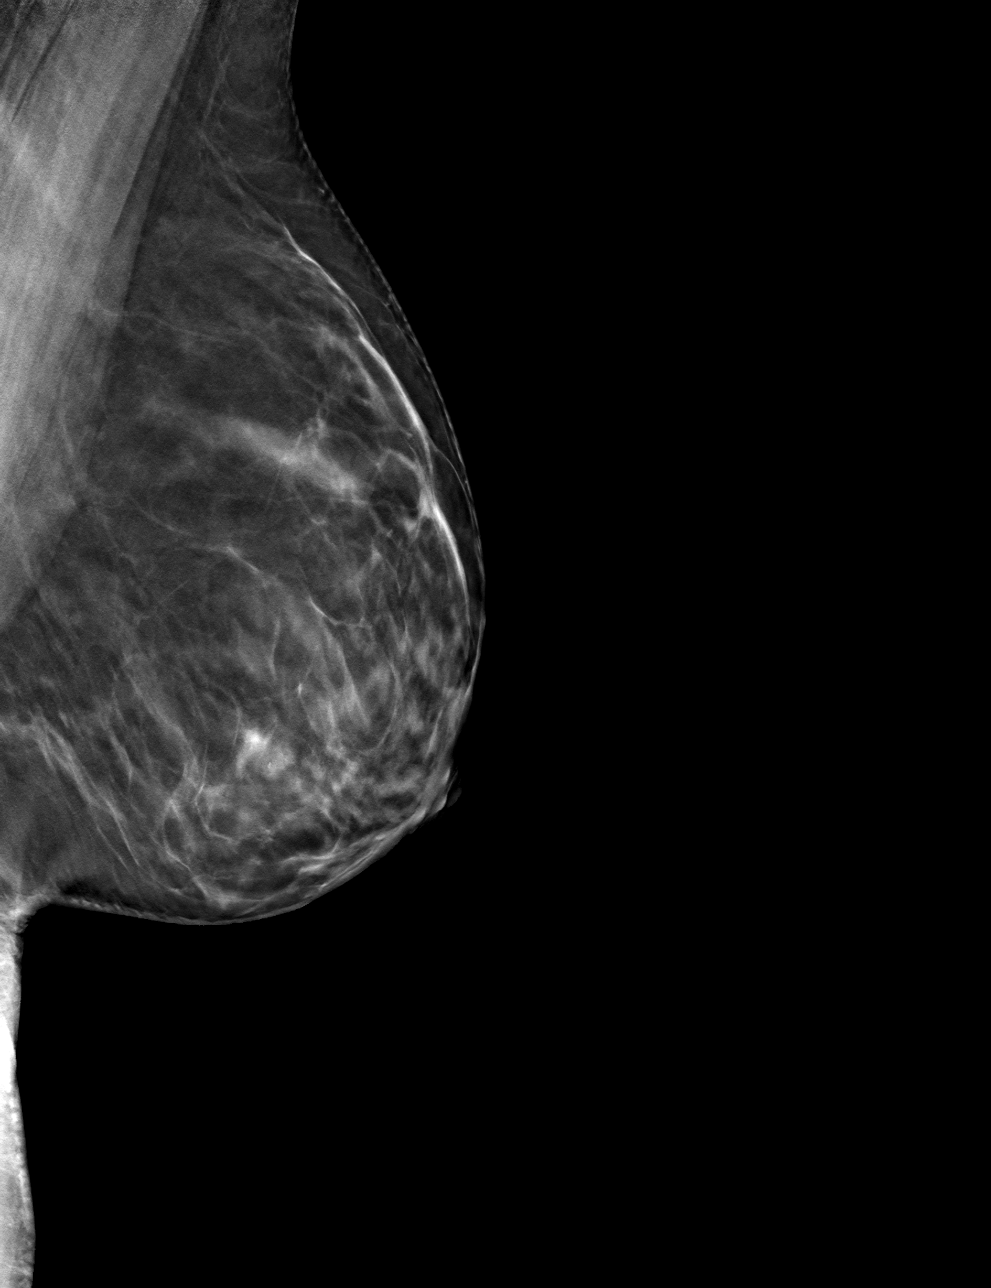

[L CC tomo · tomo slice 31/60.0]
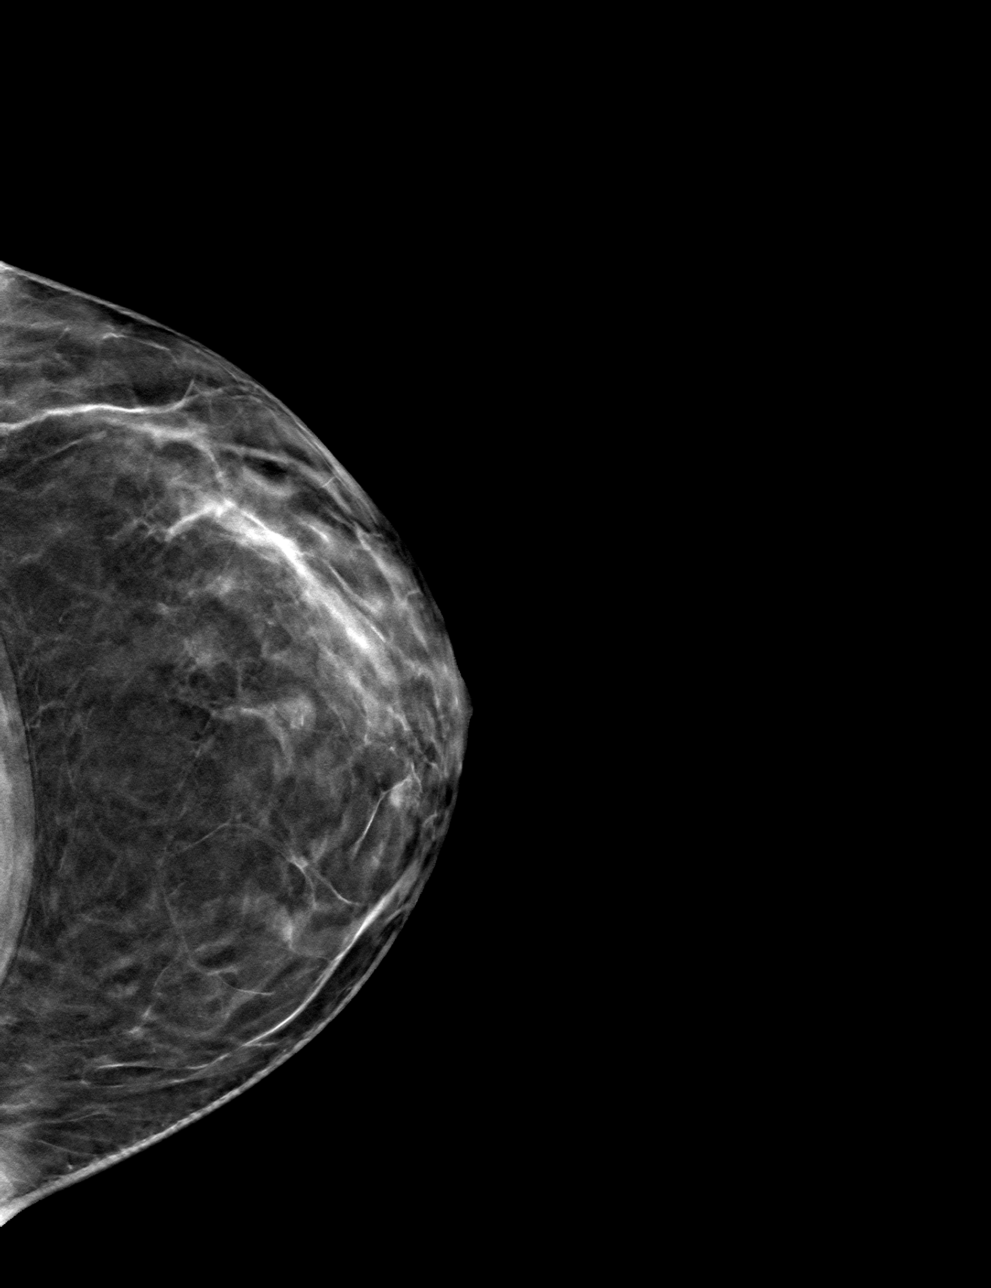

[4 of 12 positions shown; findings below may reference images not displayed]

FINDINGS: 3D Mammographic images were obtained following ultrasound guided
biopsy of a mass in the left breast at 3 o'clock retroareolar. The
biopsy marking clip is in expected position at the site of biopsy.
IMPRESSION: Appropriate positioning of the coil shaped biopsy marking clip at
the site of biopsy in the left breast at 3 o'clock retroareolar.

Final Assessment: Post Procedure Mammograms for Marker Placement

## 2023-11-16 ENCOUNTER — Encounter: Payer: Self-pay | Admitting: Family Medicine

## 2023-11-16 ENCOUNTER — Ambulatory Visit (INDEPENDENT_AMBULATORY_CARE_PROVIDER_SITE_OTHER): Admitting: Family Medicine

## 2023-11-16 ENCOUNTER — Ambulatory Visit: Payer: Self-pay | Admitting: Family Medicine

## 2023-11-16 ENCOUNTER — Telehealth: Payer: Self-pay | Admitting: Family Medicine

## 2023-11-16 ENCOUNTER — Ambulatory Visit: Admitting: Family Medicine

## 2023-11-16 VITALS — BP 110/70 | HR 76 | Resp 12 | Ht 63.0 in | Wt 113.0 lb

## 2023-11-16 DIAGNOSIS — Z1329 Encounter for screening for other suspected endocrine disorder: Secondary | ICD-10-CM

## 2023-11-16 DIAGNOSIS — E785 Hyperlipidemia, unspecified: Secondary | ICD-10-CM | POA: Diagnosis not present

## 2023-11-16 DIAGNOSIS — Z23 Encounter for immunization: Secondary | ICD-10-CM | POA: Diagnosis not present

## 2023-11-16 DIAGNOSIS — Z111 Encounter for screening for respiratory tuberculosis: Secondary | ICD-10-CM | POA: Diagnosis not present

## 2023-11-16 DIAGNOSIS — Z130 Encounter for screening for diseases of the blood and blood-forming organs and certain disorders involving the immune mechanism: Secondary | ICD-10-CM | POA: Diagnosis not present

## 2023-11-16 DIAGNOSIS — Z13228 Encounter for screening for other metabolic disorders: Secondary | ICD-10-CM

## 2023-11-16 DIAGNOSIS — Z0000 Encounter for general adult medical examination without abnormal findings: Secondary | ICD-10-CM | POA: Insufficient documentation

## 2023-11-16 LAB — BASIC METABOLIC PANEL WITH GFR
BUN: 13 mg/dL (ref 6–23)
CO2: 26 meq/L (ref 19–32)
Calcium: 9.1 mg/dL (ref 8.4–10.5)
Chloride: 105 meq/L (ref 96–112)
Creatinine, Ser: 0.62 mg/dL (ref 0.40–1.20)
GFR: 108.73 mL/min (ref >60.00)
Glucose, Bld: 82 mg/dL (ref 70–99)
Potassium: 3.8 meq/L (ref 3.5–5.1)
Sodium: 137 meq/L (ref 135–145)

## 2023-11-16 LAB — LIPID PANEL
Cholesterol: 200 mg/dL (ref 0–200)
HDL: 58.8 mg/dL (ref >39.00)
LDL Cholesterol: 120 mg/dL — ABNORMAL HIGH (ref 0–99)
NonHDL: 141.05
Total CHOL/HDL Ratio: 3
Triglycerides: 105 mg/dL (ref 0.0–149.0)
VLDL: 21 mg/dL (ref 0.0–40.0)

## 2023-11-16 LAB — HEMOGLOBIN A1C: Hgb A1c MFr Bld: 5.9 % (ref 4.6–6.5)

## 2023-11-16 NOTE — Telephone Encounter (Signed)
In folder to be signed

## 2023-11-16 NOTE — Patient Instructions (Addendum)
 A few things to remember from today's visit:  Routine general medical examination at a health care facility  Screening-pulmonary TB - Plan: QuantiFERON-TB Gold Plus  Screening for lipoid disorders - Plan: Lipid panel  Screening for endocrine, metabolic and immunity disorder - Plan: Basic metabolic panel with GFR, Hemoglobin A1c  Triga el documento para llenar.  Please be sure medication list is accurate. If a new problem present, please set up appointment sooner than planned today.  Mantenimiento de Radiographer, therapeutic en las mujeres Health Maintenance, Female Adoptar un estilo de vida saludable y recibir atencin preventiva son importantes para promover la salud y Counsellor. Consulte al mdico sobre: El esquema adecuado para hacerse pruebas y exmenes peridicos. Cosas que puede hacer por su cuenta para prevenir enfermedades y [place] sano. Qu debo saber sobre la dieta, el peso y el ejercicio? Consuma una dieta saludable  Consuma una dieta que incluya muchas verduras, frutas, productos lcteos con bajo contenido de grasa y protenas magras. No consuma muchos alimentos ricos en grasas slidas, azcares agregados o sodio. Mantenga un peso saludable El ndice de masa muscular [facility]) se cocos (keeling) islands para identificar problemas de [place]. Proporciona una estimacin de la grasa corporal basndose en el peso y la altura. Su mdico puede ayudarle a determinar su IMC y a Personnel officer o Pharmacologist un peso saludable. Haga ejercicio con regularidad Haga ejercicio con regularidad. Esta es una de las prcticas ms importantes que puede hacer por su salud. La [employer] de los adultos deben seguir estas pautas: Education officer, environmental, al menos, 150 minutos de actividad fsica por semana. El ejercicio debe aumentar la frecuencia cardaca y Media planner transpirar (ejercicio de intensidad moderada). Hacer ejercicios de fortalecimiento por lo [employer] por semana. Agregue esto a su plan de ejercicio de intensidad moderada. Pase menos  tiempo sentada. Incluso la actividad fsica ligera puede ser beneficiosa. Controle sus niveles de colesterol y lpidos en la sangre Comience a realizarse anlisis de lpidos y Oncologist en la sangre a los 20 aos y luego reptalos cada 5 aos. Hgase controlar los niveles de colesterol con mayor frecuencia si: Sus niveles de lpidos y colesterol son altos. Es mayor de 40 aos. Presenta un alto riesgo de padecer enfermedades cardacas. Qu debo saber sobre las pruebas de deteccin del cncer? Segn su historia clnica y sus antecedentes familiares, es posible que deba realizarse pruebas de deteccin del cncer en diferentes edades. Esto puede incluir pruebas de deteccin de lo siguiente: Cncer de mama. Cncer de cuello uterino. Cncer colorrectal. Cncer de piel. Cncer de pulmn. Qu debo saber sobre la enfermedad cardaca, la diabetes y la hipertensin arterial? Presin arterial y enfermedad cardaca La hipertensin arterial causa enfermedades cardacas y lesotho el riesgo de accidente cerebrovascular. Es ms probable que esto se manifieste en las personas que tienen lecturas de presin arterial alta o tienen sobrepeso. Hgase controlar la presin arterial: Cada 3 a 5 aos si tiene entre 18 y 24 aos. Todos los aos si es mayor de 40 aos. Diabetes Realcese exmenes de deteccin de la diabetes con regularidad. Este anlisis revisa el nivel de azcar en la sangre en [place]. Hgase las pruebas de deteccin: Cada tres aos despus de los 40 aos de edad si tiene un peso normal y un bajo riesgo de padecer diabetes. Con ms frecuencia y a partir de [place] edad inferior si tiene sobrepeso o un alto riesgo de padecer diabetes. Qu debo saber sobre la prevencin de infecciones? Hepatitis B Si tiene un riesgo ms alto de contraer hepatitis B,  debe someterse a un examen de deteccin de este virus. Hable con el mdico para averiguar si tiene riesgo de contraer la infeccin por hepatitis  B. Hepatitis C Se recomienda el anlisis a: [employer] 1945 y 1965. Todas las personas que tengan un riesgo de haber contrado hepatitis C. Enfermedades de transmisin sexual (ETS) Hgase las pruebas de Airline pilot de ITS, incluidas la gonorrea y la clamidia, si: Es sexualmente activa y es menor de [address]. Es mayor de [address], y Public affairs consultant informa que corre riesgo de tener este tipo de infecciones. La actividad sexual ha cambiado desde que le hicieron la ltima prueba de deteccin y tiene un riesgo mayor de tener clamidia o Copy. Pregntele al mdico si usted tiene riesgo. Pregntele al mdico si usted tiene un alto riesgo de Primary school teacher VIH. El mdico tambin puede recomendarle un medicamento recetado para ayudar a evitar la infeccin por el VIH. Si elige tomar medicamentos para prevenir el VIH, primero debe [employer] de deteccin del VIH. Luego debe hacerse anlisis cada 3 meses mientras est tomando los medicamentos. Embarazo Si est por dejar de menstruar (fase premenopusica) y usted puede quedar embarazada, busque asesoramiento antes de quedar embarazada. Tome de 400 a 800 microgramos (mcg) de cido flico todos los das si queda embarazada. Pida mtodos de control de la natalidad (anticonceptivos) si desea evitar un embarazo no deseado. Osteoporosis y rwanda La osteoporosis es una enfermedad en la que los huesos pierden los minerales y la fuerza por el avance de la edad. El resultado pueden ser fracturas en los [place]. Si tiene 65 aos o ms, o si est en riesgo de sufrir osteoporosis y fracturas, pregunte a su mdico si debe: Hacerse pruebas de deteccin de prdida sea. Tomar un suplemento de calcio o de vitamina D para reducir el riesgo de fracturas. Recibir terapia de reemplazo hormonal (TRH) para tratar los sntomas de la menopausia. Siga estas indicaciones en su casa: Consumo de alcohol No beba alcohol si: Su mdico le indica no hacerlo. Est  embarazada, puede estar embarazada o est tratando de quedar embarazada. Si bebe alcohol: Limite la cantidad que bebe a lo siguiente: De 0 a 1 bebida por da. Sepa cunta cantidad de alcohol hay en las bebidas que toma. En los [address], una medida equivale a una botella de cerveza de 12 oz (355 ml), un vaso de vino de 5 oz (148 ml) o un vaso de una bebida alcohlica de alta graduacin de 1 oz (44 ml). Estilo de vida No consuma ningn producto que contenga nicotina o tabaco. Estos productos incluyen cigarrillos, tabaco para Theatre manager y aparatos de vapeo, como los cigarrillos electrnicos. Si necesita ayuda para dejar de consumir estos productos, consulte al mdico. No consuma drogas. No comparta agujas. Solicite ayuda a su mdico si necesita apoyo o informacin para abandonar las drogas. Indicaciones generales Realcese los estudios de rutina de [address], dentales y de Wellsite geologist. Mantngase al da con las vacunas. Infrmele a su mdico si: Se siente deprimida con frecuencia. Alguna vez ha sido vctima de maltrato o no se siente seguro en su casa. Resumen Adoptar un estilo de vida saludable y recibir atencin preventiva son importantes para promover la salud y Counsellor. Siga las instrucciones del mdico acerca de una dieta saludable, el ejercicio y la realizacin de pruebas o exmenes para Hotel manager. Siga las instrucciones del mdico con respecto al control del colesterol y la presin arterial. Esta informacin no tiene como fin  reemplazar el consejo del mdico. Asegrese de hacerle al mdico cualquier pregunta que tenga. Document Revised: 09/26/2020 Document Reviewed: 09/26/2020 Elsevier Patient Education  2024 [employer].

## 2023-11-16 NOTE — Assessment & Plan Note (Signed)
 Mildly elevated triglycerides in the past, 178. Continue nonpharmacologic treatment. Further recommendation will be given according to lab result.

## 2023-11-16 NOTE — Telephone Encounter (Signed)
 Patient dropped off form for Health Examination Certificate. Form in folder

## 2023-11-16 NOTE — Progress Notes (Signed)
 Discussed the use of AI scribe software for clinical note transcription with the patient, who gave verbal consent to proceed.  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  She has not had a comprehensive physical exam since October 2022. He forgot to bring preemployment form. She requires documentation of her vision, as she is applying for a full-time teaching position, which will increase her work hours from five to eight hours a day.  She exercises daily for 30 minutes, primarily walking, and eats a diet rich in vegetables and protein, such as eggs, chicken, meat, and fish.  She sleeps approximately five hours per night.  She has never smoked and has not consumed alcohol in the past two years.  She has not seen an eye care provider in years and last visited the dentist in August of the previous year.  She has received childhood vaccinations, including MMR, in [place].   Her last Pap smear was in February 2021, with a negative HPV screening.  She has not had a mammogram since 2023. She has not seen her gynecologist in over a year.  Immunization History  Administered Date(s) Administered   DTaP 06/15/2012   Hepb-cpg 11/16/2023   Influenza,inj,Quad PF,6+ Mos 02/28/2015, 06/09/2016   Tdap 05/10/2014   Health Maintenance  Topic Date Due   Cervical Cancer Screening (HPV/Pap Cotest)  06/14/2022   COVID-19 Vaccine (1) 12/02/2023 (Originally 01/01/1986)   HPV VACCINES (1 - Risk 3-dose SCDM series) 11/15/2024 (Originally 01/02/2007)   INFLUENZA VACCINE  12/03/2023   Hepatitis B Vaccines (2 of 2 - CpG 2-dose series) 12/14/2023   DTaP/Tdap/Td (3 - Td or Tdap) 05/10/2024   Hepatitis C Screening  Completed   HIV Screening  Completed   Meningococcal B Vaccine  Aged Out   Mild hyperlipidemia, in 09/2018 triglycerides 178 and HDL  33.  Review of Systems  Constitutional:  Negative for activity change, appetite change and fever.  HENT:  Negative for hearing loss, mouth sores, sore throat and trouble swallowing.   Eyes:  Negative for redness and visual disturbance.  Respiratory:  Negative for cough, shortness of breath and wheezing.   Cardiovascular:  Negative for chest pain and leg swelling.  Gastrointestinal:  Negative for abdominal pain, nausea and vomiting.  Endocrine: Negative for cold intolerance, heat intolerance, polydipsia, polyphagia and polyuria.  Genitourinary:  Negative for decreased urine volume, dysuria and hematuria.  Musculoskeletal:  Negative for gait problem and myalgias.  Skin:  Negative for color change and rash.  Allergic/Immunologic: Negative for environmental allergies.  Neurological:  Negative for syncope, weakness and headaches.  Hematological:  Negative for adenopathy. Does not bruise/bleed easily.  Psychiatric/Behavioral:  Negative for confusion and hallucinations.   All other systems reviewed and are negative.  Current Outpatient Medications on File Prior to Visit  Medication Sig Dispense Refill   betamethasone dipropionate 0.05 % lotion Apply 1 application topically daily.     Bisacodyl  (DULCOLAX PO) Take by mouth as needed.     doxycycline  (VIBRAMYCIN ) 50 MG capsule Take 50 mg by mouth daily.     finasteride (PROSCAR) 5 MG tablet Take 2.5 mg by mouth daily.     linaclotide  (LINZESS ) 145 MCG CAPS capsule Take 1 capsule (145 mcg total) by mouth daily before breakfast. 30 capsule 0   tretinoin (RETIN-A) 0.025 % cream Apply 1 application topically at bedtime.  No current facility-administered medications on file prior to visit.   Past Medical History:  Diagnosis Date   Constipation    Dysplastic nevus 09/22/2016   - R upper lateral arm (deltoid) -S/p biopsy 09/2016 - moderate atypia w/ recs from pathology for conservative re-excision if recurrent  pigmentation at site    Headache(784.0)    frequent   Mastitis    Migraine    UTI (urinary tract infection)    Past Surgical History:  Procedure Laterality Date   NO PAST SURGERIES     Allergies  Allergen Reactions   Latex Dermatitis   Family History  Problem Relation Age of Onset   Vision loss Mother    Hyperlipidemia Father    Cancer Maternal Uncle        stomach; mets   Cancer Maternal Grandmother        stomach   Diabetes Paternal Grandfather    Colon cancer Other        grandparent   Diabetes Other    Breast cancer Neg Hx     Social History   Socioeconomic History   Marital status: Married    Spouse name: Not on file   Number of children: Not on file   Years of education: Not on file   Highest education level: Not on file  Occupational History   Not on file  Tobacco Use   Smoking status: Never   Smokeless tobacco: Never  Substance and Sexual Activity   Alcohol use: Yes    Alcohol/week: 1.0 standard drink of alcohol    Types: 1 Glasses of wine per week    Comment: occasional   Drug use: No   Sexual activity: Yes    Birth control/protection: None  Other Topics Concern   Not on file  Social History Narrative   Work or School:      Home Situation: lives with husband and 2 children (3 and 5 yo in 2018)   From [place]      Spiritual Beliefs:      Lifestyle:   Social Drivers of Corporate investment banker Strain: Not on file  Food Insecurity: Not on file  Transportation Needs: Not on file  Physical Activity: Not on file  Stress: Not on file  Social Connections: Not on file   Vitals:   11/16/23 1039  BP: 110/70  Pulse: 76  Resp: 12  SpO2: 99%   Body mass index is 20.02 kg/m.  Wt Readings from Last 3 Encounters:  11/16/23 113 lb (51.3 kg)  03/02/22 115 lb 2 oz (52.2 kg)  12/09/21 119 lb 4 oz (54.1 kg)   Physical Exam Vitals and nursing note reviewed.  Constitutional:      General: She is not in acute distress.    Appearance: She is well-developed.   HENT:     Head: Normocephalic and atraumatic.     Right Ear: Hearing, tympanic membrane, ear canal and external ear normal.     Left Ear: Hearing, tympanic membrane, ear canal and external ear normal.     Mouth/Throat:     Mouth: Mucous membranes are moist.     Pharynx: Oropharynx is clear. Uvula midline.  Eyes:     Extraocular Movements: Extraocular movements intact.     Conjunctiva/sclera: Conjunctivae normal.     Pupils: Pupils are equal, round, and reactive to light.  Neck:     Thyroid : No thyromegaly.     Trachea: No tracheal deviation.  Cardiovascular:  Rate and Rhythm: Normal rate and regular rhythm.     Pulses:          Dorsalis pedis pulses are 2+ on the right side and 2+ on the left side.     Heart sounds: No murmur heard. Pulmonary:     Effort: Pulmonary effort is normal. No respiratory distress.     Breath sounds: Normal breath sounds.  Abdominal:     Palpations: Abdomen is soft. There is no hepatomegaly or mass.     Tenderness: There is no abdominal tenderness.  Genitourinary:    Comments: Deferred to gyn. Musculoskeletal:     Comments: No major deformity or signs of synovitis appreciated.  Lymphadenopathy:     Cervical: No cervical adenopathy.     Upper Body:     Right upper body: No supraclavicular adenopathy.     Left upper body: No supraclavicular adenopathy.  Skin:    General: Skin is warm.     Findings: No erythema or rash.  Neurological:     General: No focal deficit present.     Mental Status: She is alert and oriented to person, place, and time.     Cranial Nerves: No cranial nerve deficit.     Coordination: Coordination normal.     Gait: Gait normal.     Deep Tendon Reflexes:     Reflex Scores:      Bicep reflexes are 2+ on the right side and 2+ on the left side.      Patellar reflexes are 2+ on the right side and 2+ on the left side. Psychiatric:        Mood and Affect: Mood and affect normal.   ASSESSMENT AND PLAN: Michele Gilbert  was here today annual physical examination.  Orders Placed This Encounter  Procedures   Heplisav-B  (HepB-CPG) Vaccine   QuantiFERON-TB Gold Plus   Basic metabolic panel with GFR   Hemoglobin A1c   Lipid panel   Lab Results  Component Value Date   CHOL 200 11/16/2023   HDL 58.80 11/16/2023   LDLCALC 120 (H) 11/16/2023   TRIG 105.0 11/16/2023   CHOLHDL 3 11/16/2023   Lab Results  Component Value Date   HGBA1C 5.9 11/16/2023   Lab Results  Component Value Date   NA 137 11/16/2023   CL 105 11/16/2023   K 3.8 11/16/2023   CO2 26 11/16/2023   BUN 13 11/16/2023   CREATININE 0.62 11/16/2023   GFR 108.73 11/16/2023   CALCIUM 9.1 11/16/2023   ALBUMIN 4.6 04/05/2019   GLUCOSE 82 11/16/2023  Routine general medical examination at a health care facility Assessment & Plan: We discussed the importance of regular physical activity and healthy diet for prevention of chronic illness and/or complications. Preventive guidelines reviewed. Vaccination updated. The employment form will be completed when she brings it. She is due for her appt with gynecologist, planning on arranging appt. Next CPE in a year.   Screening-pulmonary TB -     QuantiFERON-TB Gold Plus; Future  Hyperlipidemia, unspecified hyperlipidemia type Assessment & Plan: Mildly elevated triglycerides in the past, 178. Continue nonpharmacologic treatment. Further recommendation will be given according to lab result.  Orders: -     Lipid panel; Future  Screening for endocrine, metabolic and immunity disorder -     Basic metabolic panel with GFR; Future -     Hemoglobin A1c; Future  Need for hepatitis B vaccination -     Heplisav-B  (HepB-CPG) Vaccine  Return in 1 year (  on 11/15/2024) for CPE.  Koltyn Kelsay G. [place], MD  [facility]. [place] office.

## 2023-11-16 NOTE — Assessment & Plan Note (Signed)
 We discussed the importance of regular physical activity and healthy diet for prevention of chronic illness and/or complications. Preventive guidelines reviewed. Vaccination updated. The employment form will be completed when she brings it. She is due for her appt with gynecologist, planning on arranging appt. Next CPE in a year.

## 2023-11-17 NOTE — Telephone Encounter (Signed)
 I left pt a voicemail letting her know that her form is up front for pick up & that the TB test was done through blood, we are still waiting on results. Copy sent to scan.

## 2023-11-17 NOTE — Telephone Encounter (Signed)
 Copied from CRM [id number]. Topic: General - Other >> Nov 17, 2023  8:27 AM Michele Gilbert wrote: Reason for CRM: Patient is calling to see if she did the TB skin test yesterday at her appointment   Can be reached by number on file

## 2023-11-18 LAB — QUANTIFERON-TB GOLD PLUS
Mitogen-NIL: >10 [IU]/mL
NIL: 0.04 [IU]/mL
QuantiFERON-TB Gold Plus: NEGATIVE
TB1-NIL: 0.01 [IU]/mL
TB2-NIL: 0.01 [IU]/mL

## 2023-11-25 DIAGNOSIS — L6611 Classic lichen planopilaris: Secondary | ICD-10-CM | POA: Diagnosis not present

## 2023-12-14 ENCOUNTER — Ambulatory Visit (INDEPENDENT_AMBULATORY_CARE_PROVIDER_SITE_OTHER)

## 2023-12-14 DIAGNOSIS — Z23 Encounter for immunization: Secondary | ICD-10-CM | POA: Diagnosis not present

## 2023-12-15 DIAGNOSIS — Z01419 Encounter for gynecological examination (general) (routine) without abnormal findings: Secondary | ICD-10-CM | POA: Diagnosis not present

## 2023-12-15 DIAGNOSIS — Z1231 Encounter for screening mammogram for malignant neoplasm of breast: Secondary | ICD-10-CM | POA: Diagnosis not present

## 2023-12-15 DIAGNOSIS — Z1331 Encounter for screening for depression: Secondary | ICD-10-CM | POA: Diagnosis not present

## 2023-12-17 ENCOUNTER — Ambulatory Visit

## 2024-04-25 ENCOUNTER — Encounter: Payer: Self-pay | Admitting: Family Medicine

## 2024-04-25 ENCOUNTER — Ambulatory Visit: Payer: Self-pay

## 2024-04-25 ENCOUNTER — Ambulatory Visit: Admitting: Family Medicine

## 2024-04-25 VITALS — BP 98/62 | HR 86 | Temp 98.9°F | Wt 117.2 lb

## 2024-04-25 DIAGNOSIS — R059 Cough, unspecified: Secondary | ICD-10-CM

## 2024-04-25 DIAGNOSIS — J101 Influenza due to other identified influenza virus with other respiratory manifestations: Secondary | ICD-10-CM | POA: Diagnosis not present

## 2024-04-25 LAB — POCT INFLUENZA A/B
Influenza A, POC: POSITIVE — AB
Influenza B, POC: NEGATIVE

## 2024-04-25 LAB — POC COVID19 BINAXNOW: SARS Coronavirus 2 Ag: NEGATIVE

## 2024-04-25 MED ORDER — OSELTAMIVIR PHOSPHATE 75 MG PO CAPS: 75.0000 mg | ORAL_CAPSULE | Freq: Two times a day (BID) | ORAL | 0 refills | Status: AC

## 2024-04-25 NOTE — Addendum Note (Signed)
 Addended by: LADONNA INOCENTE SAILOR on: 04/25/2024 02:58 PM   Modules accepted: Orders

## 2024-04-25 NOTE — Progress Notes (Signed)
" ° °  Subjective:    Patient ID: Michele Gilbert, female    DOB: [birth year], 43 y.o.   MRN: [id number]  HPI Here for 4 days of body aches, fatigue, headaches, and sneezing. No fever or ST or cough. Taking Dayquil.    Review of Systems  Constitutional: Negative.   HENT:  Positive for congestion, postnasal drip and sneezing. Negative for ear pain and sore throat.   Eyes: Negative.   Respiratory: Negative.    Gastrointestinal: Negative.   Musculoskeletal:  Positive for myalgias.       Objective:   Physical Exam Constitutional:      Appearance: Normal appearance.  HENT:     Right Ear: Tympanic membrane, ear canal and external ear normal.     Left Ear: Tympanic membrane, ear canal and external ear normal.     Nose: Nose normal.     Mouth/Throat:     Pharynx: Oropharynx is clear.  Eyes:     Conjunctiva/sclera: Conjunctivae normal.  Cardiovascular:     Rate and Rhythm: Normal rate and regular rhythm.     Pulses: Normal pulses.     Heart sounds: Normal heart sounds.  Pulmonary:     Effort: Pulmonary effort is normal.     Breath sounds: Normal breath sounds.  Neurological:     Mental Status: She is alert.           Assessment & Plan:  Influenza A. Treat with 5 days of Tamiflu .  Garnette Olmsted, MD   "

## 2024-04-25 NOTE — Telephone Encounter (Signed)
 FYI Only or Action Required?: FYI only for provider: appointment scheduled on 12/23.  Patient was last seen in primary care on 11/16/2023 by Jordan, Betty G, MD.  Called Nurse Triage reporting Generalized Body Aches (yesterday), Headache, and Fever (Took OTC medicine).  Symptoms began 2 days ago.  Interventions attempted: OTC medications: flu max softgels.  Symptoms are: gradually worsening.  Triage Disposition: Home Care  Patient/caregiver understands and will follow disposition?:   Copied from CRM #[id number]. Topic: Clinical - Red Word Triage >> Apr 25, 2024 11:45 AM Alfonso ORN wrote: Red Word that prompted transfer to Nurse Triage: 99.5 temp , body aches yesterday , extreme pain in legs and arms , fatigue, headaches Reason for Disposition  [1] Probable influenza (fever) with no complications AND [2] NOT HIGH RISK  Answer Assessment - Initial Assessment Questions 1. SYMPTOMS: What is your main symptom or concern? (e.g., cough, fever, shortness of breath, muscle aches)     Fatigue, bodyaches, HA, low grade fever, sneezing, took Flu Max softgel  2. ONSET: When did the symptoms start?      X Sunday afternoon  3. COUGH: Do you have a cough? If Yes, ask: How bad is the cough?       Denies  4. FEVER: Do you have a fever? If Yes, ask: What is your temperature, how was it measured, and when did it start?     99 .47F   5. BREATHING DIFFICULTY: Are you having any difficulty breathing? (e.g., normal; shortness of breath, wheezing, unable to speak)      Denies  6. BETTER-SAME-WORSE: Are you getting better, staying the same or getting worse compared to yesterday?  If getting worse, ask, In what way?     Worse today  8. INFLUENZA EXPOSURE: Was there any known exposure to influenza (flu) before the symptoms began?      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 INFLUENZA VACCINE: Have you had the  flu vaccine? If Yes, ask: When did you last get it?       Denies  11. HIGH RISK FOR COMPLICATIONS: Do you have any chronic medical problems? (e.g., asthma, heart or lung disease, obesity, weak immune system) Denies  Protocols used: Influenza (Flu) Suspected-A-AH
